# Patient Record
Sex: Female | Born: 1975 | Race: White | Hispanic: No | Marital: Single | Smoking: Former smoker
Health system: Southern US, Community
[De-identification: ages and names within clinical notes are randomized; demographics above are authoritative.]

## PROBLEM LIST (undated history)

## (undated) DIAGNOSIS — T7840XA Allergy, unspecified, initial encounter: Secondary | ICD-10-CM

## (undated) HISTORY — PX: EYE SURGERY: SHX253

## (undated) HISTORY — PX: TONSILLECTOMY: SUR1361

## (undated) HISTORY — DX: Allergy, unspecified, initial encounter: T78.40XA

---

## 2004-09-12 ENCOUNTER — Other Ambulatory Visit: Admission: RE | Admit: 2004-09-12 | Discharge: 2004-09-12 | Payer: Self-pay | Admitting: Obstetrics and Gynecology

## 2007-06-13 ENCOUNTER — Emergency Department (HOSPITAL_COMMUNITY): Admission: EM | Admit: 2007-06-13 | Discharge: 2007-06-13 | Payer: Self-pay | Admitting: Family Medicine

## 2007-06-13 ENCOUNTER — Ambulatory Visit: Payer: Self-pay | Admitting: Internal Medicine

## 2007-06-18 ENCOUNTER — Ambulatory Visit: Payer: Self-pay | Admitting: Internal Medicine

## 2010-01-19 ENCOUNTER — Telehealth: Payer: Self-pay | Admitting: Internal Medicine

## 2010-01-20 ENCOUNTER — Ambulatory Visit: Payer: Self-pay | Admitting: Internal Medicine

## 2010-01-20 DIAGNOSIS — R079 Chest pain, unspecified: Secondary | ICD-10-CM

## 2010-01-20 DIAGNOSIS — J939 Pneumothorax, unspecified: Secondary | ICD-10-CM | POA: Insufficient documentation

## 2010-01-20 DIAGNOSIS — J930 Spontaneous tension pneumothorax: Secondary | ICD-10-CM

## 2010-01-26 ENCOUNTER — Ambulatory Visit: Payer: Self-pay | Admitting: Internal Medicine

## 2010-01-31 ENCOUNTER — Encounter: Payer: Self-pay | Admitting: Internal Medicine

## 2010-02-13 ENCOUNTER — Ambulatory Visit: Payer: Self-pay | Admitting: Internal Medicine

## 2010-02-13 DIAGNOSIS — J45909 Unspecified asthma, uncomplicated: Secondary | ICD-10-CM | POA: Insufficient documentation

## 2010-11-14 NOTE — Assessment & Plan Note (Signed)
Summary: Pulmonary/ ext f/u ov with mild asthma flare rx dulera   Primary Provider/Referring Provider:  none  CC:  2 wk followup.  Pt declines to have cxr stating "I don't feel anything anymore".  She states that she is "getting over head cold"- c/o chest congestion and prod cough with clear sputum.Lori Jennings  History of Present Illness: 68 yowf quit smoking 2007 not typically limited by breathing including aerobics.  January 20, 2010 Acute visit.  Pt last seen in 2008 for pneumothorax- she c/o "sensation" on her left side- "feels uncomfortable" x 3 days acute onset when layed down to sleep.   She states that this worsens when she moves a certain way.  She states that she feels like she can not get in a deep enough breath. no cough.    January 26, 2010 1 wk followup.  Pt states that her left side discomfort is about 50 % improved. Denies any new complaints today, no cough.  Pt denies any significant sore throat, dysphagia, itching, sneezing,  nasal congestion or excess secretions,  fever, chills, sweats, unintended wt loss, exertional cp, hempoptysis, change in activity tolerance  orthopnea pnd or leg swelling.  no longer needing tramadol for pain.  Feb 13, 2010 2 wk followup.  Pt declines to have cxr stating "I don't feel anything anymore".  She states that she is "getting over head cold"- c/o chest congestion and prod cough with clear sputum. no sob or cp. Pt denies any significant sore throat, dysphagia, itching, sneezing,  nasal congestion or excess secretions,  fever, chills, sweats, unintended wt loss, laterallizing or classic  pleuritic or exertional cp, hempoptysis, change in activity tolerance  orthopnea pnd or leg swelling. Pt also denies any obvious fluctuation in symptoms with weather or environmental change or other alleviating or aggravating factors.       Current Medications (verified): 1)  Seasonique 0.15-0.03 &0.01 Mg Tabs (Levonorgest-Eth Estrad 91-Day) .... As Directed 2)  Aleve 220 Mg Tabs  (Naproxen Sodium) .... As Directed As Needed 3)  Advil 200 Mg Tabs (Ibuprofen) .... As Directed As Needed 4)  Multivitamins  Tabs (Multiple Vitamin) .Lori Jennings.. 1 Once Daily 5)  Tramadol Hcl 50 Mg  Tabs (Tramadol Hcl) .... One To Two By Mouth Every 4-6 Hours For Pain  Allergies (verified): No Known Drug Allergies  Past History:  Past Medical History: Pneumothorax Left      - Chest Tube in [place] in 2003     - Recurrence in Aug 2008, treated with bedrest     - Recurrence January 20, 2010 30% > 10% January 26, 2010      - Alpha 1 Genotype January 20, 2010 >> Asthma flare Feb 13, 2010       -  75% effective hfa Feb 13, 2010 > started on dulera 100  Vital Signs:  Patient profile:   35 year old female Weight:      156 pounds O2 Sat:      97 % on Room air Temp:     97.7 degrees F oral Pulse rate:   77 / minute BP sitting:   100 / 62  (left arm)  Vitals Entered By: Vernie Murders (Feb 13, 2010 11:46 AM)  O2 Flow:  Room air  Physical Exam  Additional Exam:  amb pleasant wf nad wt 159 January 20, 2010 > 158 January 26, 2010 > 156 Feb 13, 2010  HEENT mild turbinate edema.  Oropharynx no  thrush or excess pnd or cobblestoning.  No JVD or cervical adenopathy. Mild accessory muscle hypertrophy. Trachea midline, nl thryroid. Chest was hyperinflated by percussion with slt diminished breath sounds esp on Left and moderate increased exp time without wheeze. Hoover sign positive at mid inspiration. Regular rate and rhythm without murmur gallop or rub or increase P2 or edema.  Abd: no hsm, nl excursion. Ext warm without cyanosis or clubbing.     Impression & Recommendations:  Problem # 1:  PNEUMOTHORAX (ICD-512.8)  Clnically resolved but present exacerbation of asthma risks recurence  I had an extended discussion with the patient today lasting 15 to 20 minutes of a 25 minute visit on the following issues:   ok to rx consevatively but next step is vats/ pleurodesis if recurs and need to control risks such  as asthma flare  Orders: Est. Patient Level IV (82956)  Problem # 2:  ASTHMA, UNSPECIFIED (ICD-493.90) Clearly more of an asthmatic than previously appreciated in setting of uri or allergic rhinitis  I spent extra time with the patient today explaining optimal mdi  technique.  This improved from  25-75%  Medications Added to Medication List This Visit: 1)  Dulera 100-5 Mcg/act Aero (Mometasone furo-formoterol fum) .... 2 puffs first thing  in am and 2 puffs again in pm about 12 hours later  Patient Instructions: 1)  Dulera 100 2 puffs first thing  in am and 2 puffs again in pm about 12 hours later if have any symptoms at all - this wil reduce your wheezing and risk of a recurrence of pneumothorax from wheezing 2)  Go to ER directly if symptoms of pneumothorax recur Prescriptions: DULERA 100-5 MCG/ACT AERO (MOMETASONE FURO-FORMOTEROL FUM) 2 puffs first thing  in am and 2 puffs again in pm about 12 hours later  #1 x 11   Entered and Authorized by:   Nyoka Cowden MD   Signed by:   Nyoka Cowden MD on 02/13/2010   Method used:   Print then Give to Patient   RxID:   [id number]

## 2010-11-14 NOTE — Assessment & Plan Note (Signed)
Summary: Pulmonary/ f/u ptx, down to 10%    Primary Provider/Referring Provider:  none  CC:  1 wk followup.  Pt states that her left side discomfort is about 50 % improved. Denies any new complaints today.  History of Present Illness: 35 yowf quit smoking 2007 not typically limited by breathing including aerobics.  January 20, 2010 Acute visit.  Pt last seen in 2008 for pneumothorax- she c/o "sensation" on her left side- "feels uncomfortable" x 3 days acute onset when layed down to sleep.   She states that this worsens when she moves a certain way.  She states that she feels like she can not get in a deep enough breath. no cough.    January 26, 2010 1 wk followup.  Pt states that her left side discomfort is about 50 % improved. Denies any new complaints today, no cough.  Pt denies any significant sore throat, dysphagia, itching, sneezing,  nasal congestion or excess secretions,  fever, chills, sweats, unintended wt loss, exertional cp, hempoptysis, change in activity tolerance  orthopnea pnd or leg swelling.  no longer needing tramadol for pain.  Current Medications (verified): 1)  Seasonique 0.15-0.03 &0.01 Mg Tabs (Levonorgest-Eth Estrad 91-Day) .... As Directed 2)  Aleve 220 Mg Tabs (Naproxen Sodium) .... As Directed As Needed 3)  Advil 200 Mg Tabs (Ibuprofen) .... As Directed As Needed 4)  Multivitamins  Tabs (Multiple Vitamin) .Marland Kitchen.. 1 Once Daily 5)  Tramadol Hcl 50 Mg  Tabs (Tramadol Hcl) .... One To Two By Mouth Every 4-6 Hours For Pain  Allergies (verified): No Known Drug Allergies  Past History:  Past Medical History: Pneumothorax Left      - Chest Tube in [place] in 2003     - Recurrence in Aug 2008, treated with bedrest     - Recurrence January 20, 2010 30% > 10% January 26, 2010      - Alpha 1 Genotype January 20, 2010 >>  Vital Signs:  Patient profile:   35 year old female Weight:      158 pounds O2 Sat:      98 % on Room air Temp:     98.2 degrees F oral Pulse rate:   76 /  minute BP sitting:   94 / 60  (left arm)  Vitals Entered By: Vernie Murders (January 26, 2010 10:37 AM)  O2 Flow:  Room air  Physical Exam  Additional Exam:  amb pleasant wf nad wt 159 January 20, 2010 > 158 January 26, 2010  HEENT mild turbinate edema.  Oropharynx no thrush or excess pnd or cobblestoning.  No JVD or cervical adenopathy. Mild accessory muscle hypertrophy. Trachea midline, nl thryroid. Chest was hyperinflated by percussion with slt diminished breath sounds esp on Left and moderate increased exp time without wheeze. Hoover sign positive at mid inspiration. Regular rate and rhythm without murmur gallop or rub or increase P2 or edema.  Abd: no hsm, nl excursion. Ext warm without cyanosis or clubbing.     CXR  Procedure date:  01/26/2010  Findings:      Comparison: 01/20/2010.   Findings: There is an approximate 10% left pneumothorax.  The pneumothorax is smaller than noted previously.  Normal cardiomediastinal silhouette.  Bony thorax intact.   IMPRESSION: Approximate 10% left pneumothorax.  Impression & Recommendations:  Problem # 1:  PNEUMOTHORAX (ICD-512.8) Responding to conservative rx.  Discussed in detail all the  indications, usual  risks and alternatives  relative to the benefits  with patient who agrees to proceed with conservative rx  Other Orders: T-2 View CXR (71020TC) Est. Patient Level III (16109)  Patient Instructions: 1)  We will with xray report

## 2010-11-14 NOTE — Assessment & Plan Note (Signed)
Summary: Pulmonary/ acute ex eval for recurrent ptx    Primary Provider/Referring Provider:  none  CC:  Acute visit.  Pt last seen in 2008 for pneumothorax- she c/o "sensation" on her left side- "feels uncomfortable" x 3 days.  She states that this worsens when she moves a certain way.  She states that she feels like she can not get in a deep enough breath.  .  History of Present Illness: 46 yowf quit smoking 2007 not typically limited by breathing including aerobics.  January 20, 2010 Acute visit.  Pt last seen in 2008 for pneumothorax- she c/o "sensation" on her left side- "feels uncomfortable" x 3 days acute onset when layed down to sleep.   She states that this worsens when she moves a certain way.  She states that she feels like she can not get in a deep enough breath. no cough.  Pt denies any significant sore throat, dysphagia, itching, sneezing,  nasal congestion or excess secretions,  fever, chills, sweats, unintended wt loss,  hempoptysis, change in activity tolerance  orthopnea pnd or leg swelling .   Current Medications (verified): 1)  Seasonique 0.15-0.03 &0.01 Mg Tabs (Levonorgest-Eth Estrad 91-Day) .... As Directed 2)  Aleve 220 Mg Tabs (Naproxen Sodium) .... As Directed As Needed 3)  Advil 200 Mg Tabs (Ibuprofen) .... As Directed As Needed 4)  Multivitamins  Tabs (Multiple Vitamin) .Marland Kitchen.. 1 Once Daily  Allergies (verified): No Known Drug Allergies  Past History:  Past Medical History: Pneumothorax Left      - Chest Tube in [place] in 2003     - Reucurrence in Aug 2008, treated with bedrest     - Recurrence January 20, 2010 30%      - Alpha 1 Genotype January 20, 2010 >>  Family History: Negative for respiratory diseases or atopy   Social History: Former smoker.  Quit in 2007.  Vital Signs:  Patient profile:   35 year old female Height:      67 inches Weight:      159.38 pounds BMI:     25.05 O2 Sat:      97 % on Room air Temp:     98.1 degrees F oral Pulse rate:    71 / minute BP sitting:   110 / 74  (left arm)  Vitals Entered By: Vernie Murders (January 20, 2010 9:46 AM)  O2 Flow:  Room air  Physical Exam  Additional Exam:  amb pleasant wf nad wt 159 January 20, 2010  HEENT mild turbinate edema.  Oropharynx no thrush or excess pnd or cobblestoning.  No JVD or cervical adenopathy. Mild accessory muscle hypertrophy. Trachea midline, nl thryroid. Chest was hyperinflated by percussion with slt diminished breath sounds esp on Left and moderate increased exp time without wheeze. Hoover sign positive at mid inspiration. Regular rate and rhythm without murmur gallop or rub or increase P2 or edema.  Abd: no hsm, nl excursion. Ext warm without cyanosis or clubbing.     CXR  Procedure date:  01/20/2010  Findings:      Comparison: [facility] chest x-ray 06/18/2007 and [facility] chest x-ray 06/13/2007.   Findings: Since prior studies, recurrent left approximate 30% pneumothorax is seen.  Lungs are otherwise clear.  No tension shift seen.  Mediastinum, hila, heart size and configuration appear normal.  Osseous structures appear normal.  No pleural fluid visualized.   IMPRESSION:   1.  Recurrent approximate 30% left pneumothorax  without tension shift. 2.  No additional acute abnormality.  Impression & Recommendations:  Problem # 1:  PNEUMOTHORAX (ICD-512.8)  Recurrent on left and should probably consier pleurodesis  Discussed in detail all the  indications, usual  risks and alternatives  relative to the benefits with patient who agrees to proceed with conservative rx.  See instructions for specific recommendations    Orders: Est. Patient Level V (50093)  Medications Added to Medication List This Visit: 1)  Seasonique 0.15-0.03 &0.01 Mg Tabs (Levonorgest-eth estrad 91-day) .... As directed 2)  Aleve 220 Mg Tabs (Naproxen sodium) .... As directed as needed 3)  Advil 200 Mg Tabs (Ibuprofen) .... As directed as needed 4)   Multivitamins Tabs (Multiple vitamin) .Marland Kitchen.. 1 once daily 5)  Tramadol Hcl 50 Mg Tabs (Tramadol hcl) .... One to two by mouth every 4-6 hours for pain  Other Orders: T-2 View CXR (71020TC)  Patient Instructions: 1)  Bedrest x 72 hours 2)  Tramadol 50 mg 1-2 every 4-6 hours as needed for pain 3)  Go to ER for worsen 4)  Please schedule a follow-up appointment in 1 week, sooner if needed  Prescriptions: TRAMADOL HCL 50 MG  TABS (TRAMADOL HCL) One to two by mouth every 4-6 hours for pain  #40 x 0   Entered and Authorized by:   Nyoka Cowden MD   Signed by:   Nyoka Cowden MD on 01/20/2010   Method used:   Electronically to        [facility]  [address] [phone] (retail)       [address]       [place], [place]  [address]       Ph: [phone]       Fax: [phone]   RxID:   [id number]

## 2010-11-14 NOTE — Progress Notes (Signed)
Summary: On Call- ? Recurrent pneumothorax  Phone Note Call from Patient   Summary of Call: On call- Patient last saw Dr Sherene Sires for second spontaneous pneumothorax in 2008. Calls this evening saying she feels again on same side as if she has another, starting this afternoon with some progression. Roomate will be home in 2 hours. I told her to go now to ER for CXR and eval. She indicated she wanted to wait it out and come to office in AM, hoping like last time to avoid chest tube. I could not support this safely and explained potential for rapid worsening, potential barotrauma. Told her not Urgent Care but ER. I think she is going to try to hold out and call office in AM. Initial call taken by: Waymon Budge MD,  January 19, 2010 7:41 PM

## 2011-02-20 ENCOUNTER — Other Ambulatory Visit: Payer: Self-pay | Admitting: Family Medicine

## 2011-02-20 DIAGNOSIS — I720 Aneurysm of carotid artery: Secondary | ICD-10-CM

## 2011-02-23 ENCOUNTER — Ambulatory Visit
Admission: RE | Admit: 2011-02-23 | Discharge: 2011-02-23 | Disposition: A | Discharge status: Home / Self Care | Payer: 59 | Source: Ambulatory Visit | Attending: Family Medicine | Admitting: Family Medicine

## 2011-02-23 DIAGNOSIS — I720 Aneurysm of carotid artery: Secondary | ICD-10-CM

## 2011-02-27 NOTE — Assessment & Plan Note (Signed)
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  She says she is  100% feeling better today with no pleuritic pain, fevers, chills sweats,  or dyspnea.   PHYSICAL EXAMINATION:  She is a pleasant, ambulatory white female in no  acute distress.  VITAL SIGNS:  Stable vital signs.  HEENT:  Unremarkable.  Oropharynx is clear.  There is no evidence of  subcutaneous air.  LUNGS:  The lung fields are clear bilaterally to auscultation and  percussion, and Hamman's sign is negative.  CARDIAC:  Regular rate and rhythm without murmur, gallop, or rub.  ABDOMEN:  Soft and benign.  EXTREMITIES:  Warm without calf tenderness, cyanosis, clubbing, or  edema.   Chest x-ray shows no pneumothorax.   IMPRESSION:  A very small apical pneumothorax spontaneously resolved,  status post previous chest pneumothorax in 2003.  Although technically,  she has now had a recurrence and is at risk for further pneumothoraces,  note she had a normal CT scan in 2003 and has no obvious bullus lesions  nor evidence of air flow obstruction clinically, status post smoking  cessation in 2007.   I therefore recommended conservative followup noting she is really not  at risk (she does not scuba dive or fly anything but commercial air  traffic, and no wilderness trips).  Therefore, if she has recurrent  symptoms, she can go to the emergency room for consideration for a VATS  pleurodesis.  We will see her back here on a p.r.n. basis.     Charlaine Dalton. Sherene Sires, MD, [facility]  Electronically Signed    MBW/MedQ  DD: 06/18/2007  DT: 06/18/2007  Job #: [id number]   cc:   Dr. [facility]

## 2011-02-27 NOTE — Assessment & Plan Note (Signed)
[facility]                             PULMONARY OFFICE NOTE   NAME:Seliga, Ange                           MRN:          [id number]  DATE:06/13/2007                            DOB:          01-22-1976    CHIEF COMPLAINT:  Chest pain.   HISTORY:  This is a 35 year old white female who developed a  pneumothorax in December of 2003 spontaneously and underwent chest tube  placement for less than 24 hours before it recurred and then required  chest tube placement for 3 subsequent days.  Since that time, she has  had no trouble at all and has been enjoying excellent aerobic exercise  tolerance.  She does remember straining hard having a bowel movement  yesterday, and then approximately 24 hours ago developed left-sided  shoulder pain that waxed and waned over the next 24 hours.  It did not  stop her from getting on an airplane yesterday to fly home from [place],  but she did notice, when she tried to lie down last night, that the pain  was worse and came to urgent care this morning where she was diagnosed  with a very small left pneumothorax.   The patient denies any cough.  She denies any significant dyspnea at  present, orthopnea, PND, or leg swelling, myalgias, arthralgias, fevers,  chills.   PAST MEDICAL HISTORY:  Significant for hyperlipidemia and remote  tonsillectomy.   MEDICATIONS:  Depo-Provera for contraceptives.   SOCIAL HISTORY:  She quit smoking in January of 2007.  She works as an  Chiropodist.   FAMILY HISTORY:  Negative for respiratory diseases, atopy.   REVIEW OF SYSTEMS:  Taken in detail in the worksheet and negative except  as outlined above.   PHYSICAL EXAMINATION:  This is a healthy-appearing ambulatory white  female in no acute distress.  VITAL SIGNS:  Stable vital signs.  HEENT:  Unremarkable.  Oropharynx is clear.  NECK:  Supple without cervical adenopathy or tenderness. Trachea is  midline.  No thyromegaly.  LUNGS:  The  lung fields are perfectly clear bilaterally to auscultation  and percussion.  I could not appreciate any crepitus over the shoulder  or chest wall.  CARDIAC:  Regular rate and rhythm without murmur, gallop, or rub, or  Hamman's crunch appreciable.  ABDOMEN:  Soft and benign.  No palpable organomegaly, mass, or  tenderness.  EXTREMITIES:  Warm without calf tenderness, cyanosis, clubbing, or  edema.   I reviewed a chest x-ray and agree that she has a very tiny left apical  pneumothorax with no associated pleural effusion.   She tells me that she had a CT scan done 5 years ago that showed no  blebs or abnormalities.   IMPRESSION:  Recurrent spontaneous left pneumothorax.  It is very small  and it has been almost 24 hours since the onset and is barely  detectable.  It very well may be that the defect is so small that it  will close spontaneously and not need further therapy.  I have  recommended, therefore, bed rest and Vicodin to  supplement Delsym and  coughing over the next 5 days, and then return here for a chest x-ray.  Any time, if her pain worsens or if she becomes more short of breath,  then I have asked her to go straight to the emergency room, where I  believe video-assisted thoracic surgery pleurodesis would be the best  option, perhaps with a bridge Heimlich valve versus routine  thoracostomy tube, depending on the urgency of the situation.     Charlaine Dalton. Sherene Sires, MD, [facility]  Electronically Signed    MBW/MedQ  DD: 06/13/2007  DT: 06/14/2007  Job #: [id number]   cc:   Janelle Floor, DO

## 2011-02-27 NOTE — Assessment & Plan Note (Signed)
[facility]                             PULMONARY OFFICE NOTE   NAME:Vetter, Keriann                           MRN:          [id number]  DATE:06/18/2007                            DOB:          03-Jul-1976    CANCELLED DICTATION     Casimiro Needle B. Sherene Sires, MD, [facility]  Electronically Signed    MBW/MedQ  DD: 06/18/2007  DT: 06/18/2007  Job #: [id number]

## 2011-06-20 ENCOUNTER — Telehealth: Payer: Self-pay | Admitting: Internal Medicine

## 2011-06-20 NOTE — Telephone Encounter (Signed)
Spoke with the Lori Jennings and she states she has seen Dr. Sherene Sires in the past for a pneumothorax. She states she thinks she might have another one and was requesting to get a CXR now and have it reviewed because she is flying out in the morning for a business conference. I advised the Lori Jennings that since she was last seen 02-2010 she would need an OV to be evaluated an nothing is available at this time. I advised the Lori Jennings to go to an Urgent Care where she could get an cxr and see a doctor at same time. Lori Jennings states understanding. Carron Curie, CMA

## 2012-01-03 ENCOUNTER — Other Ambulatory Visit: Payer: Self-pay | Admitting: Obstetrics and Gynecology

## 2012-01-03 DIAGNOSIS — R928 Other abnormal and inconclusive findings on diagnostic imaging of breast: Secondary | ICD-10-CM

## 2012-01-10 ENCOUNTER — Ambulatory Visit
Admission: RE | Admit: 2012-01-10 | Discharge: 2012-01-10 | Disposition: A | Discharge status: Home / Self Care | Payer: 59 | Source: Ambulatory Visit | Attending: Obstetrics and Gynecology | Admitting: Obstetrics and Gynecology

## 2012-01-10 DIAGNOSIS — R928 Other abnormal and inconclusive findings on diagnostic imaging of breast: Secondary | ICD-10-CM

## 2012-01-11 IMAGING — MG MM DIGITAL DIAGNOSTIC UNILAT*L*
3 series · 3 of 3 positions shown · non-contrast
Comparison: None.

CLINICAL DATA: Patient presents for additional views of the left
breast as follow-up to a recent screening exam suggesting a
possible mass.

DIGITAL DIAGNOSTIC LEFT MAMMOGRAM  AND LEFT BREAST ULTRASOUND:

[L CC]
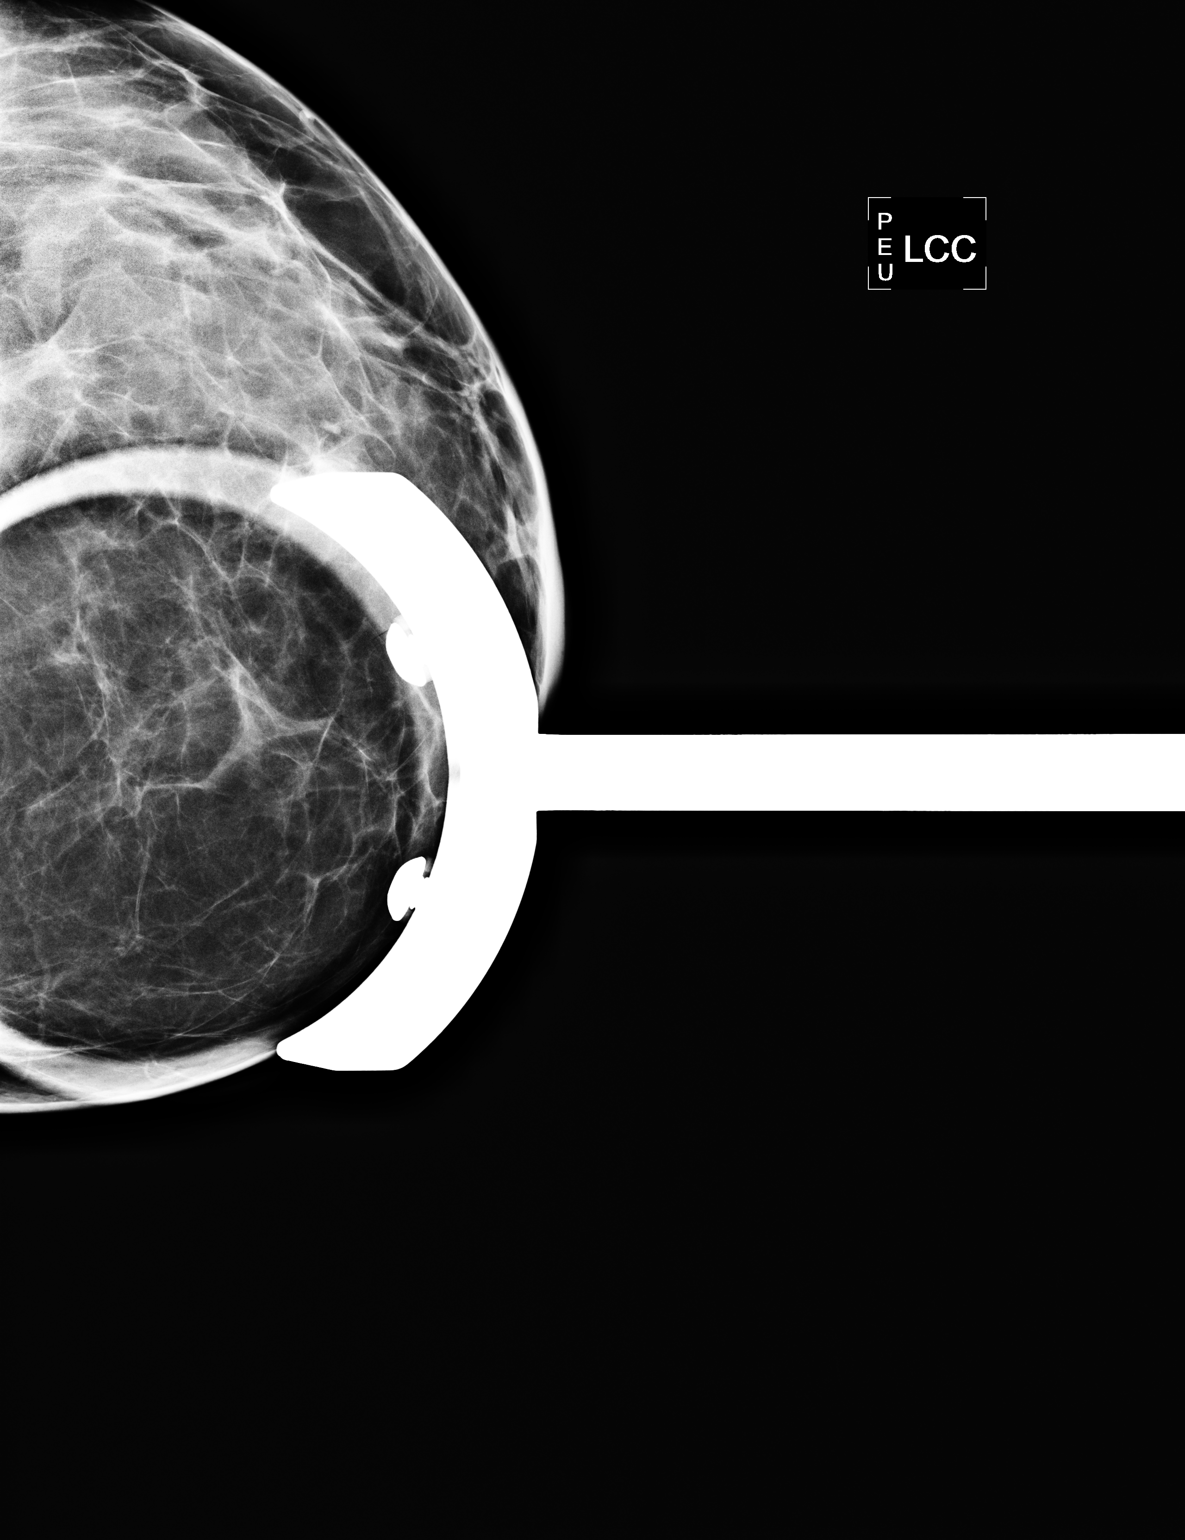

[L MLO]
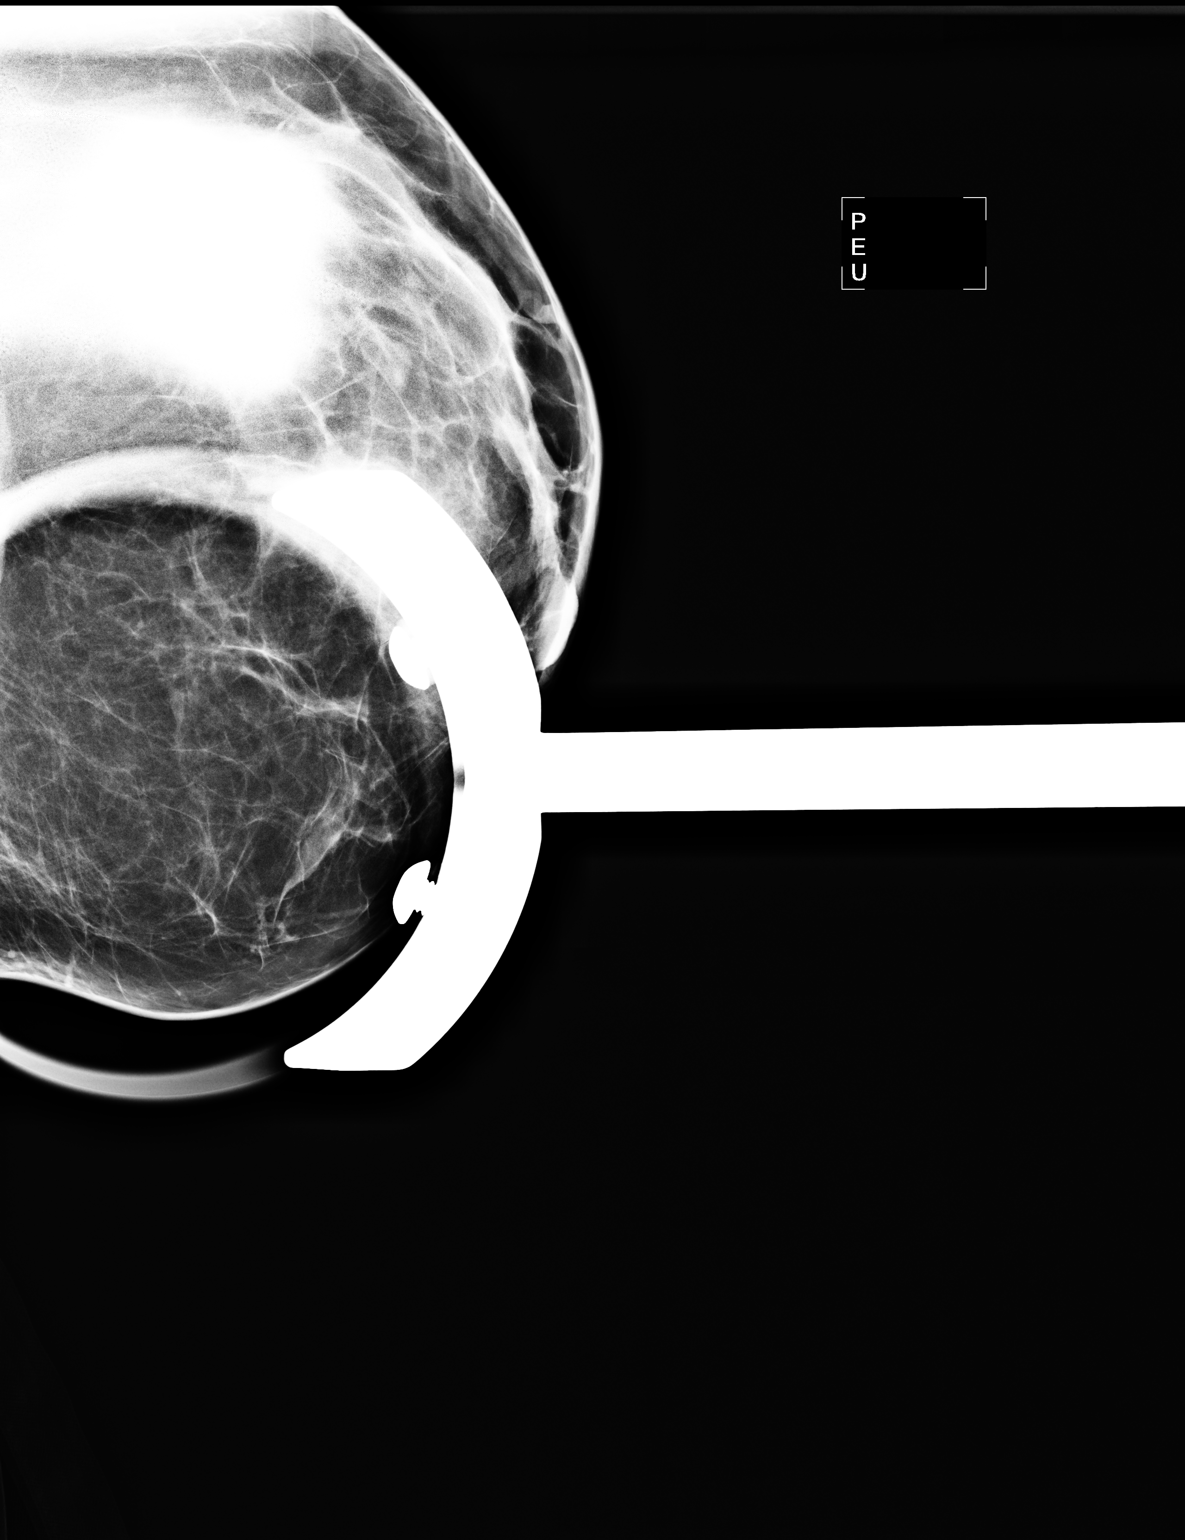

[L ML]
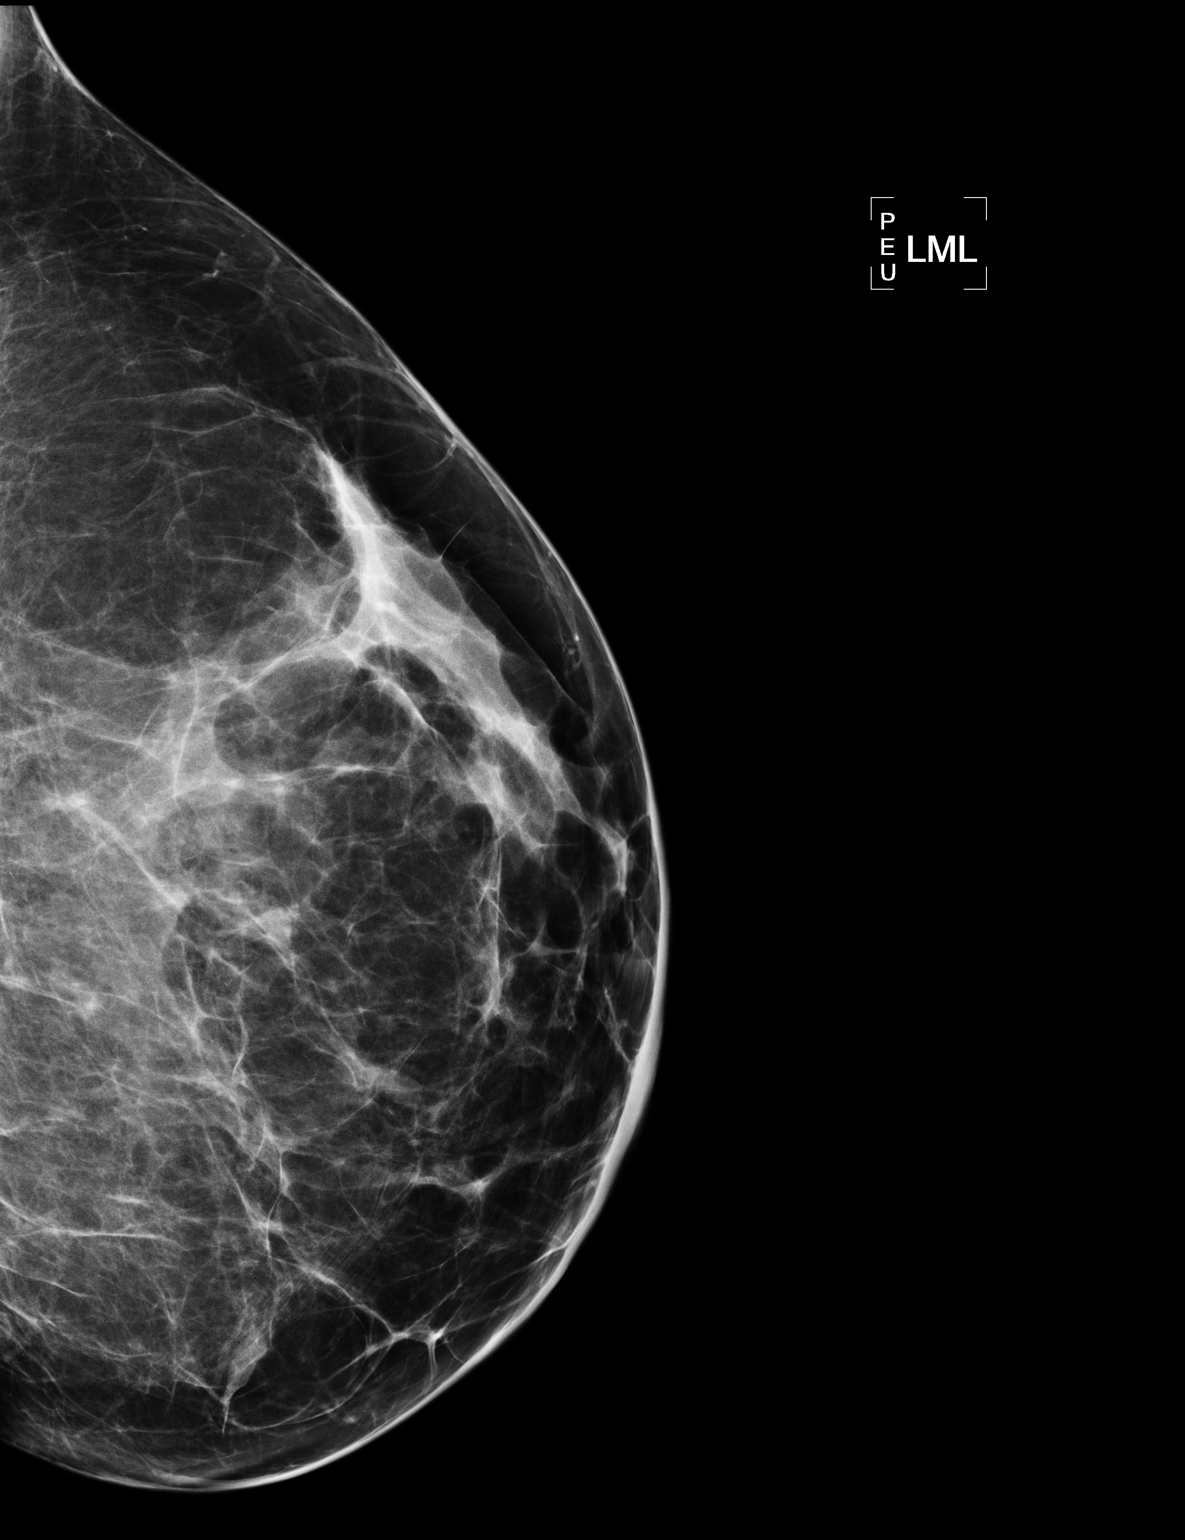

[3 of 3 positions shown; findings below may reference images not displayed]

FINDINGS: Spot compression and true lateral images were obtained.
There is no definite focal abnormality on the compression CC image
although there is persistence of a an equivocal well defined ovoid
density in the inferior breast on the compression MLO and true
lateral images.

Ultrasound is performed, showing no focal abnormality over the
inferior left breast.
IMPRESSION: Probable benign density over the inferior left breast not seen
sonographically.

Recommendations:  Recommend 6-month follow-up diagnostic left
breast mammogram.

BI-RADS CATEGORY 3:  Probably benign finding(s) - short interval
follow-up suggested.

## 2012-10-25 ENCOUNTER — Ambulatory Visit: Payer: 59 | Admitting: Family Medicine

## 2012-10-25 ENCOUNTER — Encounter: Payer: Self-pay | Admitting: Family Medicine

## 2012-10-25 VITALS — BP 98/66 | HR 86 | Resp 16

## 2012-10-25 DIAGNOSIS — R519 Headache, unspecified: Secondary | ICD-10-CM

## 2012-10-25 DIAGNOSIS — S0180XA Unspecified open wound of other part of head, initial encounter: Secondary | ICD-10-CM

## 2012-10-25 DIAGNOSIS — Z23 Encounter for immunization: Secondary | ICD-10-CM

## 2012-10-25 DIAGNOSIS — S0191XA Laceration without foreign body of unspecified part of head, initial encounter: Secondary | ICD-10-CM

## 2012-10-25 DIAGNOSIS — R51 Headache: Secondary | ICD-10-CM

## 2012-10-25 DIAGNOSIS — R55 Syncope and collapse: Secondary | ICD-10-CM

## 2012-10-25 NOTE — Progress Notes (Signed)
Verbal consent obtained from the patient.  Local anesthesia with 6cc Lidocaine 2% with epinephrine.  Wound scrubbed with soap and water and rinsed.  Wound closed with #5 5-0 Vicryl SQ, and #11 5-0 Ethilon SI & HM sutures.  Wound cleansed and dressed.

## 2012-10-25 NOTE — Progress Notes (Signed)
This is a 37 year old woman who comes in after having a syncopal episode in having her for had, suffering a laceration on the right and central part of her forehead. Patient describes starting a "cleansing program" yesterday. When she got up this morning she was a little lightheaded went into the kitchen to fix a shake. He was at this point that she became dizzy passed out and hit her head..  The patient recovered consciousness, she was diaphoretic and weak. She cleansed her laceration and drove herself to the urgent care here. At the present time she has mild headache but has no dizziness, diplopia, difficulty moving.  Objective: No acute distress. Patient was brought back urgently because of the laceration 5 cm laceration is noted which extends diagonally above the right eye for the upper mid for head. There is no bony depression underneath. It is tender. Patient was given ibuprofen for the headache which subsided over time.  Blood pressure checking revealed a reading of 90/60 sitting as well as 90/60 standing  Neurological exam: Alert, cooperative, appropriate. Cranial nerves III through XII: Intact with pupils equal and reactive as well. TMs normal, no battle sign Neck full range of motion nontender Chest: Clear Heart: Regular no murmur Abdomen soft nontender  Wound is repaired by physician assistant, she'll Tinnie Gens  Assessment: Syncopal episode secondary to change in dietary pattern and mild dehydration with 5 cm full-thickness laceration in the forehead. Patient stable at present. She was given fluids and crackers to support her blood pressure and she felt comfortable driving home.  Plan: Return 5-7 days for suture removal, call if she develops nausea, worsening headache, weakness, further syncope

## 2012-10-25 NOTE — Patient Instructions (Signed)

## 2012-10-26 ENCOUNTER — Telehealth: Payer: Self-pay

## 2012-10-26 NOTE — Telephone Encounter (Signed)
PATIENT IS REQUESTING A REFERRAL FOR CT SCAN FOR HEAD INJURY - SIXTEEN STITCHES PLACED 10/25/12 HERE. LIVES BY SELF   LITTLE FATIGUED, OTHERWISE OKAY, CONCERNED B/C SHE LIVES ALONE    CBN:  [id number] (H)

## 2012-10-27 ENCOUNTER — Telehealth: Payer: Self-pay

## 2012-10-27 NOTE — Telephone Encounter (Signed)
Message has already been sent to PA pool. Duplicate. Advised patient we are working on this.

## 2012-10-27 NOTE — Telephone Encounter (Signed)
Please advise on CT scan

## 2012-10-27 NOTE — Telephone Encounter (Signed)
Sounds like exam was reassuring and there was no indication for imaging at the time, but will forward to Dr. Elbert Ewings to advise on referral for CT scan

## 2012-10-27 NOTE — Telephone Encounter (Signed)
Pt is calling needing to talk with dr Milus Glazier about her recent visit and if there is going to be a referral of a ct scan   Please call [phone]

## 2012-10-28 ENCOUNTER — Ambulatory Visit (INDEPENDENT_AMBULATORY_CARE_PROVIDER_SITE_OTHER): Payer: 59 | Admitting: Family Medicine

## 2012-10-28 ENCOUNTER — Telehealth: Payer: Self-pay | Admitting: Radiology

## 2012-10-28 VITALS — BP 91/65 | HR 84 | Temp 98.1°F | Resp 18 | Ht 67.0 in | Wt 157.0 lb

## 2012-10-28 DIAGNOSIS — S060XAA Concussion with loss of consciousness status unknown, initial encounter: Secondary | ICD-10-CM

## 2012-10-28 DIAGNOSIS — R51 Headache: Secondary | ICD-10-CM

## 2012-10-28 DIAGNOSIS — S060X9A Concussion with loss of consciousness of unspecified duration, initial encounter: Secondary | ICD-10-CM | POA: Insufficient documentation

## 2012-10-28 DIAGNOSIS — R55 Syncope and collapse: Secondary | ICD-10-CM

## 2012-10-28 NOTE — Telephone Encounter (Signed)
Patient should come in today for follow up.  I would see her myself, but I am not allowed to do so per [facility] directive.

## 2012-10-28 NOTE — Progress Notes (Signed)
Nolie Fantroy is a 37 y.o. female who presents to [facility] today for followup for head laceration and probable concussion. Patient was seen on 10/25/12 following a fall at home.  She had not eat and felt lightheaded preparing breakfast and passed out and fell to the floor striking her head on the ground.  She suffered a large for head laceration which was repaired in the office.  Following the injury she felt a mild headache and fogginess for several days.  She notes that currently she feels well with no headaches fogginess confusion fatigue photo or photosensitivity, dizziness or lightheadedness.    Prior to the fall patient was doing a cleanse had not eaten well.  She felt immediately better following eating.     PMH: Reviewed otherwise healthy History  Substance Use Topics  . Smoking status: Current Every Day Smoker  . Smokeless tobacco: Not on file     Comment: 10-15 cigarettes per day  . Alcohol Use: Yes     Comment: rare   ROS as above  Medications reviewed. No current outpatient prescriptions on file.    Exam:  BP 91/65  Pulse 84  Temp 98.1 F (36.7 C) (Oral)  Resp 18  Ht 5\' 7"  (1.702 m)  Wt 157 lb (71.215 kg)  BMI 24.59 kg/m2  SpO2 97%  LMP 10/24/2012 Gen: Well NAD Forehead: Well appearing laceration with sutures no erythema or exudate. Neuro: Alert and oriented normal balance gait station. Concentration and memory are intact. Normal serial sevens. Normal coordination and strength sensation intact. Cranial 2 through 12 are intact  No results found for this or any previous visit (from the past 72 hour(s)).  Assessment and Plan: 37 y.o. female with possible concussion following a fall and for head laceration. She is doing well. Advised return to work as tolerated.   As for the cause of her fall I feel that it was orthostatic hypotension related to her fast.  It does not appear to be cardiogenic or dangerous in origin. She will followup with her primary care Dr. about this as  needed. Discussed warning signs or symptoms. Please see discharge instructions. Patient expresses understanding.

## 2012-10-28 NOTE — Telephone Encounter (Signed)
Ok to order CT scan and result it to Dr. Elbert Ewings.

## 2012-10-28 NOTE — Telephone Encounter (Signed)
Patient advised, per Dr L to come in for this again.

## 2012-10-28 NOTE — Patient Instructions (Addendum)
Thank you for coming in today. I think you may have had a concussion.  Go back to work tomorrow. If you get a headache or feel bad take it easy.  Most concussions last 7 -10 days.  Come back as needed.   Concussion Direct trauma to the head often causes a condition known as a concussion. This injury will interfere with brain function and may cause you to lose consciousness. The consequences of a concussion are usually temporary, but repetitive concussions can be very dangerous. If you have multiple concussions, you will have a greater risk of long-term effects, such as slurred speech, slow movements, impaired thinking, or tremors. The severity of a concussion is based on the length and severity of the interference with brain activity. SYMPTOMS   Symptoms of a concussion vary depending on the severity of the injury. Very mild concussions may even occur without any noticeable symptoms. Swelling in the area of the injury is not related to the seriousness of the injury.    Mild concussion:   Temporary loss of consciousness.   Memory loss (amnesia) for a short time.   Emotional instability.   Confusion.   Severe concussion:   Usually prolonged loss of consciousness.   One pupil (the black part in the middle of the eye) is larger than the other.   Changes in vision (including blurring).   Changes in breathing.   Disturbed balance (equilibrium).   Headaches.   Confusion.   Nausea or vomiting.  CAUSES   A concussion is the result of trauma to the head. When the head is subjected to such an injury, the brain strikes against the inner wall of the skull. This impact is what causes the damage to the brain. The force of injury is related to severity of injury. The most severe concussions are associated with incidents that involve large impact forces such as motor vehicle accidents. Wearing a helmet will reduce the severity of trauma to the head, but concussions may still occur if you are  wearing a helmet. RISK INCREASES WITH:  Contact sports (football, hockey, rugby, or lacrosse).   Fighting sports (martial arts or boxing).   Riding bicycles, motorcycles, or horses (when you ride without a helmet).  PREVENTION  Wear proper protective headgear and ensure correct fit.   Wear seat belts when driving and riding in a car.   Do not drink or use mind-altering drugs and drive.  PROGNOSIS   Concussions are typically curable if they are recognized and treated early. If a severe concussion or multiple concussions go untreated, then the complications may be life-threatening or cause permanent disability and brain damage. RELATED COMPLICATIONS    Permanent brain damage (slurred speech, slow movement, impaired thinking, or tremors).   Bleeding under the skull (subdural hemorrhage or hematoma, epidural hematoma).   Bleeding into the brain.   Prolonged healing time if usual activities are resumed too soon.   Infection if skin over the concussion site is broken.   Increased risk of future concussions (less trauma is required for a second concussion than the first).  TREATMENT   Treatment initially requires immediate evaluation to determine the severity of the concussion. Occasionally, a hospital stay may be required for observation and treatment.   Avoid exertion. Bed rest for the first 24 to 48 hours is recommended.   Return to play is a controversial subject due to the increased risk for future injury as well as permanent disability and should be discussed at length with your  treating caregiver. Many factors such as the severity of the concussion and whether this is the first, second, or third concussion play a role in timing a patient's return to sports.   MEDICATION   Do not give any medicine, including non-prescription acetaminophen or aspirin, until the diagnosis is certain. These medicines may mask developing symptoms.   SEEK IMMEDIATE MEDICAL CARE IF:    Symptoms get  worse or do not improve in 24 hours.   Any of the following symptoms occur:   Vomiting.   The inability to move arms and legs equally well on both sides.   Fever.   Neck stiffness.   Pupils of unequal size, shape, or reactivity.   Convulsions.   Noticeable restlessness.   Severe headache that persists for longer than 4 hours after injury.   Confusion, disorientation, or mental status changes.  Document Released: 10/01/2005 Document Revised: 12/24/2011 Document Reviewed: 01/13/2009 [facility] Patient Information 2013 [place], [place].

## 2012-10-28 NOTE — Telephone Encounter (Signed)
Patient calling again about scan, she states she is having a dull ache in her temple at the area of injury and is very concerned, message sent to Dr L, but not responded to yet, can we  Order a CT scan, or should pt return to clinic?

## 2012-10-28 NOTE — Telephone Encounter (Signed)
Called patient to advise she would need an office visit for this.

## 2012-11-01 ENCOUNTER — Ambulatory Visit (INDEPENDENT_AMBULATORY_CARE_PROVIDER_SITE_OTHER): Payer: 59 | Admitting: Family Medicine

## 2012-11-01 VITALS — BP 100/70 | HR 80 | Temp 98.0°F | Resp 16 | Ht 67.0 in | Wt 156.8 lb

## 2012-11-01 DIAGNOSIS — Z4802 Encounter for removal of sutures: Secondary | ICD-10-CM

## 2012-11-01 NOTE — Progress Notes (Signed)
   Patient ID: Lori Jennings MRN: [id number], DOB: 1976/05/02 37 y.o. Date of Encounter: 11/01/2012, 11:21 AM  Primary Physician: No primary provider on file.  Chief Complaint: Suture removal    See note from Jan. 11  HPI: 37 y.o. y/o female with injury to forehead Here for suture removal s/p placement on forehead Doing well No issues/complaints Afebrile/ No chills No erythema No pain Able to move without difficulty Normal sensation  History reviewed. No pertinent past medical history.   Home Meds: Prior to Admission medications   Not on File    Allergies:  Allergies  Allergen Reactions  . Ciprocinonide (Fluocinolone) Hives    Physical Exam: Blood pressure 100/70, pulse 80, temperature 98 F (36.7 C), temperature source Oral, resp. rate 16, height 5\' 7"  (1.702 m), weight 156 lb 12.8 oz (71.124 kg), last menstrual period 10/24/2012, SpO2 98.00%., Body mass index is 24.56 kg/(m^2). General: Well developed, well nourished, in no acute distress. Head: Normocephalic, atraumatic, sclera non-icteric, no xanthomas, nares are without discharge.  Neck: Supple. Lungs: Breathing is unlabored. Heart: Normal rate. Msk:  Strength and tone appear normal for age. Wound:  Wound well health without erythema, swelling, or tenderness to palpation. FROM and 5/5 strength with abnormal sensation above the laceration (mild anesthesia) Skin: See above, otherwise dry without rash or erythema. Extremities: No clubbing or cyanosis. No edema. Neuro: Alert and oriented X 3. Moves all extremities spontaneously.  Psych:  Responds to questions appropriately with a normal affect.   PROCEDURE: Verbal consent obtained. 11 sutures removed without difficulty.  Assessment and Plan: 37 y.o. y/o female here for suture removal for wound described above. -Sutures removed per above -Wound resolved -RTC prn  Signed, Elvina Sidle, MD 11/01/2012 11:21 AM

## 2012-11-07 ENCOUNTER — Telehealth: Payer: Self-pay | Admitting: Radiology

## 2012-11-07 NOTE — Telephone Encounter (Signed)
Patients CT scan has been denied by the insurance, patient improving/ Please cancel the scan, I have advised her already the insurance will not cover scan.

## 2012-12-31 ENCOUNTER — Ambulatory Visit: Payer: 59 | Admitting: Physician Assistant

## 2012-12-31 VITALS — BP 90/60 | HR 71 | Temp 98.6°F | Resp 18 | Wt 165.0 lb

## 2012-12-31 DIAGNOSIS — L03115 Cellulitis of right lower limb: Secondary | ICD-10-CM

## 2012-12-31 DIAGNOSIS — L02419 Cutaneous abscess of limb, unspecified: Secondary | ICD-10-CM

## 2012-12-31 MED ORDER — DOXYCYCLINE HYCLATE 100 MG PO CAPS: 100.0000 mg | ORAL_CAPSULE | Freq: Two times a day (BID) | ORAL | Status: DC

## 2012-12-31 NOTE — Progress Notes (Signed)
   Patient ID: Lori Jennings MRN: [id number], DOB: [birth year], 37 y.o. Date of Encounter: 12/31/2012, 7:15 PM  Primary Physician: No primary provider on file.  Chief Complaint: Right knee abrasion x 10 days  HPI: 37 y.o. year old female with history below presents with a right knee abrasion for 10 days. Patient was roller skating 10 days ago and went to come off of the skating rink hitting her right knee on a stair in a "rug burn" fashion she states. She was wearing jeans at the time. Since the injury she has washed the wound two times per day and left it open to the air. She has been working out daily. She is concerned because is has become slightly more erythematous and a little sore. Afebrile throughout. She is otherwise doing well and without complaints.    Past Medical History  Diagnosis Date  . Allergy      Home Meds: Prior to Admission medications   Medication Sig Start Date End Date Taking? Authorizing Provider           Allergies:  Allergies  Allergen Reactions  . Ciprocinonide (Fluocinolone) Hives    History   Social History  . Marital Status: Single    Spouse Name: N/A    Number of Children: N/A  . Years of Education: N/A   Occupational History  . Not on file.   Social History Main Topics  . Smoking status: Former Games developer  . Smokeless tobacco: Not on file     Comment: 10-15 cigarettes per day  . Alcohol Use: Yes     Comment: rare  . Drug Use: No  . Sexually Active: No   Other Topics Concern  . Not on file   Social History Narrative  . No narrative on file     Review of Systems: Constitutional: negative for chills, fever, or fatigue  Dermatological: positive for wound and erythema   Physical Exam: Blood pressure 90/60, pulse 71, temperature 98.6 F (37 C), temperature source Oral, resp. rate 18, weight 165 lb (74.844 kg), last menstrual period 12/17/2012., Body mass index is 25.84 kg/(m^2). General: Well developed, well nourished, in no acute  distress. Head: Normocephalic, atraumatic, eyes without discharge, sclera non-icteric, nares are without discharge.   Neck: Supple. Full ROM. Lungs: Breathing is unlabored. Heart: Normal rate. Msk:  Strength and tone normal for age. Extremities/Skin: Warm and dry. No clubbing or cyanosis. No edema. No rashes or suspicious lesions. Right anterior knee with superficial abrasion over patella. Healthy granulation tissue present. Mild erythema laterally. No purulence expressed. Resolving ecchymosis inferiorly. No bony TTP. FROM. 5/5 strength.    Neuro: Alert and oriented X 3. Moves all extremities spontaneously. Gait is normal. CNII-XII grossly in tact. Psych:  Responds to questions appropriately with a normal affect.   Wound washed with soap and water. Irrigated. Xeroform dressing applied.    ASSESSMENT AND PLAN:  37 y.o. female with superficial abrasion to anterior knee with early cellulitis -Wound care per above -Doxycycline 100 mg 1 po bid 320 no RF -Daily dressing changes  -RTC prn   Signed, Eula Listen, PA-C 12/31/2012 7:15 PM

## 2013-01-19 ENCOUNTER — Other Ambulatory Visit: Payer: Self-pay | Admitting: Obstetrics and Gynecology

## 2013-01-19 DIAGNOSIS — N6320 Unspecified lump in the left breast, unspecified quadrant: Secondary | ICD-10-CM

## 2013-01-29 ENCOUNTER — Ambulatory Visit
Admission: RE | Admit: 2013-01-29 | Discharge: 2013-01-29 | Disposition: A | Discharge status: Home / Self Care | Payer: 59 | Source: Ambulatory Visit | Attending: Obstetrics and Gynecology | Admitting: Obstetrics and Gynecology

## 2013-01-29 DIAGNOSIS — N6320 Unspecified lump in the left breast, unspecified quadrant: Secondary | ICD-10-CM

## 2014-01-11 ENCOUNTER — Ambulatory Visit (INDEPENDENT_AMBULATORY_CARE_PROVIDER_SITE_OTHER): Payer: No Typology Code available for payment source | Admitting: Family Medicine

## 2014-01-11 VITALS — BP 82/62 | HR 62 | Temp 97.9°F | Resp 16 | Ht 67.25 in | Wt 155.0 lb

## 2014-01-11 DIAGNOSIS — T3695XA Adverse effect of unspecified systemic antibiotic, initial encounter: Secondary | ICD-10-CM

## 2014-01-11 DIAGNOSIS — J209 Acute bronchitis, unspecified: Secondary | ICD-10-CM

## 2014-01-11 DIAGNOSIS — J329 Chronic sinusitis, unspecified: Secondary | ICD-10-CM

## 2014-01-11 DIAGNOSIS — B379 Candidiasis, unspecified: Secondary | ICD-10-CM

## 2014-01-11 MED ORDER — AMOXICILLIN-POT CLAVULANATE 875-125 MG PO TABS: 1.0000 | ORAL_TABLET | Freq: Two times a day (BID) | ORAL | Status: DC

## 2014-01-11 MED ORDER — FLUCONAZOLE 150 MG PO TABS: 150.0000 mg | ORAL_TABLET | Freq: Once | ORAL | Status: DC

## 2014-01-11 MED ORDER — ALBUTEROL SULFATE HFA 108 (90 BASE) MCG/ACT IN AERS: 2.0000 | INHALATION_SPRAY | Freq: Four times a day (QID) | RESPIRATORY_TRACT | Status: DC | PRN

## 2014-01-11 NOTE — Progress Notes (Signed)
Sinusitis - Plan: amoxicillin-clavulanate (AUGMENTIN) 875-125 MG per tablet, albuterol (PROAIR HFA) 108 (90 BASE) MCG/ACT inhaler  Acute bronchitis - Plan: amoxicillin-clavulanate (AUGMENTIN) 875-125 MG per tablet, albuterol (PROAIR HFA) 108 (90 BASE) MCG/ACT inhaler  Antibiotic-induced yeast infection - Plan: fluconazole (DIFLUCAN) 150 MG tablet  Signed, Elvina Sidle, MD     Patient ID: Lori Jennings MRN: [id number], DOB: [birth year], 38 y.o. Date of Encounter: 01/11/2014, 10:18 AM  Primary Physician: No primary provider on file.  Chief Complaint:  Chief Complaint  Patient presents with  . Cough    X 3 weeks  . Chest Congestion    X 3 weeks    HPI: 38 y.o. year old female presents with 21 day history of nasal congestion, post nasal drip, sore throat, sinus pressure, and cough. Afebrile. No chills. Nasal congestion thick and green/yellow. Sinus pressure is the worst symptom. Cough is productive secondary to post nasal drip and not associated with time of day. Ears feel full, leading to sensation of muffled hearing. Has tried OTC cold preps without success. No GI complaints.   Patient works at [employer] especially pleased with the way her forehead laceration turned out from suture repair done by [employer], PA-C.  No recent antibiotics, recent travels, or sick contacts   No leg trauma, sedentary periods, h/o cancer, or tobacco use.  Past Medical History  Diagnosis Date  . Allergy      Home Meds: Prior to Admission medications   Medication Sig Start Date End Date Taking? Authorizing Provider  albuterol (PROAIR HFA) 108 (90 BASE) MCG/ACT inhaler Inhale 2 puffs into the lungs every 6 (six) hours as needed for wheezing or shortness of breath (cough or wheezing). 01/11/14   Elvina Sidle, MD  amoxicillin-clavulanate (AUGMENTIN) 875-125 MG per tablet Take 1 tablet by mouth 2 (two) times daily. 01/11/14   Elvina Sidle, MD  fluconazole (DIFLUCAN) 150 MG  tablet Take 1 tablet (150 mg total) by mouth once. 01/11/14   Elvina Sidle, MD    Allergies:  Allergies  Allergen Reactions  . Ciprocinonide [Fluocinolone] Hives  . Doxycycline Hives    History   Social History  . Marital Status: Single    Spouse Name: N/A    Number of Children: N/A  . Years of Education: N/A   Occupational History  . Not on file.   Social History Main Topics  . Smoking status: Former Games developer  . Smokeless tobacco: Not on file     Comment: 10-15 cigarettes per day  . Alcohol Use: Yes     Comment: rare  . Drug Use: No  . Sexual Activity: No   Other Topics Concern  . Not on file   Social History Narrative  . No narrative on file     Review of Systems: Constitutional: negative for chills, fever, night sweats or weight changes Cardiovascular: negative for chest pain or palpitations Respiratory: negative for hemoptysis, wheezing, or shortness of breath Abdominal: negative for abdominal pain, nausea, vomiting or diarrhea Dermatological: negative for rash Neurologic: negative for headache   Physical Exam: Blood pressure 82/62, pulse 62, temperature 97.9 F (36.6 C), temperature source Oral, resp. rate 16, height 5' 7.25" (1.708 m), weight 155 lb (70.308 kg), last menstrual period 12/28/2013, SpO2 98.00%., Body mass index is 24.1 kg/(m^2). General: Well developed, well nourished, in no acute distress. Head: Normocephalic, atraumatic, eyes without discharge, sclera non-icteric, nares are congested. Bilateral auditory canals clear, TM's are without perforation, pearly grey with reflective cone of light  bilaterally. Serous effusion bilaterally behind TM's. Maxillary sinus TTP. Oral cavity moist, dentition normal. Posterior pharynx with post nasal drip and mild erythema. No peritonsillar abscess or tonsillar exudate. Neck: Supple. No thyromegaly. Full ROM. No lymphadenopathy. Lungs: Clear bilaterally to auscultation without wheezes, rales, or rhonchi.  Breathing is unlabored.  Heart: RRR with S1 S2. No murmurs, rubs, or gallops appreciated. Msk:  Strength and tone normal for age. Extremities: No clubbing or cyanosis. No edema. Neuro: Alert and oriented X 3. Moves all extremities spontaneously. CNII-XII grossly in tact. Psych:  Responds to questions appropriately with a normal affect.     ASSESSMENT AND PLAN:  38 y.o. year old female with sinusitis -  -Tylenol/Motrin prn -Rest/fluids -RTC precautions -RTC 3-5 days if no improvement  Signed, Elvina SidleKurt Pastor Sgro, MD 01/11/2014 10:18 AM

## 2014-01-11 NOTE — Patient Instructions (Signed)
Sinusitis  Sinusitis is redness, soreness, and swelling (inflammation) of the paranasal sinuses. Paranasal sinuses are air pockets within the bones of your face (beneath the eyes, the middle of the forehead, or above the eyes). In healthy paranasal sinuses, mucus is able to drain out, and air is able to circulate through them by way of your nose. However, when your paranasal sinuses are inflamed, mucus and air can become trapped. This can allow bacteria and other germs to grow and cause infection.  Sinusitis can develop quickly and last only a short time (acute) or continue over a long period (chronic). Sinusitis that lasts for more than 12 weeks is considered chronic.   CAUSES   Causes of sinusitis include:   Allergies.   Structural abnormalities, such as displacement of the cartilage that separates your nostrils (deviated septum), which can decrease the air flow through your nose and sinuses and affect sinus drainage.   Functional abnormalities, such as when the small hairs (cilia) that line your sinuses and help remove mucus do not work properly or are not present.  SYMPTOMS   Symptoms of acute and chronic sinusitis are the same. The primary symptoms are pain and pressure around the affected sinuses. Other symptoms include:   Upper toothache.   Earache.   Headache.   Bad breath.   Decreased sense of smell and taste.   A cough, which worsens when you are lying flat.   Fatigue.   Fever.   Thick drainage from your nose, which often is green and may contain pus (purulent).   Swelling and warmth over the affected sinuses.  DIAGNOSIS   Your caregiver will perform a physical exam. During the exam, your caregiver may:   Look in your nose for signs of abnormal growths in your nostrils (nasal polyps).   Tap over the affected sinus to check for signs of infection.   View the inside of your sinuses (endoscopy) with a special imaging device with a light attached (endoscope), which is inserted into your  sinuses.  If your caregiver suspects that you have chronic sinusitis, one or more of the following tests may be recommended:   Allergy tests.   Nasal culture A sample of mucus is taken from your nose and sent to a lab and screened for bacteria.   Nasal cytology A sample of mucus is taken from your nose and examined by your caregiver to determine if your sinusitis is related to an allergy.  TREATMENT   Most cases of acute sinusitis are related to a viral infection and will resolve on their own within 10 days. Sometimes medicines are prescribed to help relieve symptoms (pain medicine, decongestants, nasal steroid sprays, or saline sprays).   However, for sinusitis related to a bacterial infection, your caregiver will prescribe antibiotic medicines. These are medicines that will help kill the bacteria causing the infection.   Rarely, sinusitis is caused by a fungal infection. In theses cases, your caregiver will prescribe antifungal medicine.  For some cases of chronic sinusitis, surgery is needed. Generally, these are cases in which sinusitis recurs more than 3 times per year, despite other treatments.  HOME CARE INSTRUCTIONS    Drink plenty of water. Water helps thin the mucus so your sinuses can drain more easily.   Use a humidifier.   Inhale steam 3 to 4 times a day (for example, sit in the bathroom with the shower running).   Apply a warm, moist washcloth to your face 3 to 4 times a day,   or as directed by your caregiver.   Use saline nasal sprays to help moisten and clean your sinuses.   Take over-the-counter or prescription medicines for pain, discomfort, or fever only as directed by your caregiver.  SEEK IMMEDIATE MEDICAL CARE IF:   You have increasing pain or severe headaches.   You have nausea, vomiting, or drowsiness.   You have swelling around your face.   You have vision problems.   You have a stiff neck.   You have difficulty breathing.  MAKE SURE YOU:    Understand these  instructions.   Will watch your condition.   Will get help right away if you are not doing well or get worse.  Document Released: 10/01/2005 Document Revised: 12/24/2011 Document Reviewed: 10/16/2011  [employer] Patient Information 2014 [employer]          Bronchitis  Bronchitis is inflammation of the airways that extend from the windpipe into the lungs (bronchi). The inflammation often causes mucus to develop, which leads to a cough. If the inflammation becomes severe, it may cause shortness of breath.  CAUSES   Bronchitis may be caused by:    Viral infections.    Bacteria.    Cigarette smoke.    Allergens, pollutants, and other irritants.   SIGNS AND SYMPTOMS   The most common symptom of bronchitis is a frequent cough that produces mucus. Other symptoms include:   Fever.    Body aches.    Chest congestion.    Chills.    Shortness of breath.    Sore throat.   DIAGNOSIS   Bronchitis is usually diagnosed through a medical history and physical exam. Tests, such as chest X-rays, are sometimes done to rule out other conditions.   TREATMENT   You may need to avoid contact with whatever caused the problem (smoking, for example). Medicines are sometimes needed. These may include:   Antibiotics. These may be prescribed if the condition is caused by bacteria.   Cough suppressants. These may be prescribed for relief of cough symptoms.    Inhaled medicines. These may be prescribed to help open your airways and make it easier for you to breathe.    Steroid medicines. These may be prescribed for those with recurrent (chronic) bronchitis.  HOME CARE INSTRUCTIONS   Get plenty of rest.    Drink enough fluids to keep your urine clear or pale yellow (unless you have a medical condition that requires fluid restriction). Increasing fluids may help thin your secretions and will prevent dehydration.    Only take over-the-counter or prescription medicines as directed by your health care  provider.   Only take antibiotics as directed. Make sure you finish them even if you start to feel better.   Avoid secondhand smoke, irritating chemicals, and strong fumes. These will make bronchitis worse. If you are a smoker, quit smoking. Consider using nicotine gum or skin patches to help control withdrawal symptoms. Quitting smoking will help your lungs heal faster.    Put a cool-mist humidifier in your bedroom at night to moisten the air. This may help loosen mucus. Change the water in the humidifier daily. You can also run the hot water in your shower and sit in the bathroom with the door closed for 5 10 minutes.    Follow up with your health care provider as directed.    Wash your hands frequently to avoid catching bronchitis again or spreading an infection to others.   SEEK MEDICAL CARE IF:  Your symptoms   do not improve after 1 week of treatment.   SEEK IMMEDIATE MEDICAL CARE IF:   Your fever increases.   You have chills.    You have chest pain.    You have worsening shortness of breath.    You have bloody sputum.   You faint.   You have lightheadedness.   You have a severe headache.    You vomit repeatedly.  MAKE SURE YOU:    Understand these instructions.   Will watch your condition.   Will get help right away if you are not doing well or get worse.  Document Released: 10/01/2005 Document Revised: 07/22/2013 Document Reviewed: 05/26/2013  [employer] Patient Information 2014 [employer]

## 2022-07-09 ENCOUNTER — Other Ambulatory Visit: Payer: Self-pay | Admitting: Obstetrics and Gynecology

## 2022-07-09 DIAGNOSIS — R928 Other abnormal and inconclusive findings on diagnostic imaging of breast: Secondary | ICD-10-CM

## 2022-07-23 ENCOUNTER — Ambulatory Visit
Admission: RE | Admit: 2022-07-23 | Discharge: 2022-07-23 | Disposition: A | Discharge status: Home / Self Care | Payer: Self-pay | Source: Ambulatory Visit | Attending: Obstetrics and Gynecology | Admitting: Obstetrics and Gynecology

## 2022-07-23 ENCOUNTER — Other Ambulatory Visit: Payer: Self-pay | Admitting: Obstetrics and Gynecology

## 2022-07-23 DIAGNOSIS — R928 Other abnormal and inconclusive findings on diagnostic imaging of breast: Secondary | ICD-10-CM

## 2022-07-23 DIAGNOSIS — N6320 Unspecified lump in the left breast, unspecified quadrant: Secondary | ICD-10-CM

## 2022-08-13 ENCOUNTER — Telehealth: Payer: Self-pay

## 2022-08-13 ENCOUNTER — Ambulatory Visit
Admission: RE | Admit: 2022-08-13 | Discharge: 2022-08-13 | Disposition: A | Discharge status: Home / Self Care | Payer: BC Managed Care – PPO | Source: Ambulatory Visit

## 2022-08-13 ENCOUNTER — Ambulatory Visit (INDEPENDENT_AMBULATORY_CARE_PROVIDER_SITE_OTHER): Payer: BC Managed Care – PPO

## 2022-08-13 VITALS — BP 97/69 | HR 91 | Temp 98.0°F | Resp 16

## 2022-08-13 DIAGNOSIS — J4521 Mild intermittent asthma with (acute) exacerbation: Secondary | ICD-10-CM

## 2022-08-13 DIAGNOSIS — R069 Unspecified abnormalities of breathing: Secondary | ICD-10-CM | POA: Diagnosis not present

## 2022-08-13 DIAGNOSIS — R059 Cough, unspecified: Secondary | ICD-10-CM | POA: Diagnosis not present

## 2022-08-13 DIAGNOSIS — J309 Allergic rhinitis, unspecified: Secondary | ICD-10-CM | POA: Diagnosis not present

## 2022-08-13 MED ORDER — METHYLPREDNISOLONE 4 MG PO TBPK: ORAL_TABLET | ORAL | 0 refills | Status: AC

## 2022-08-13 MED ORDER — MONTELUKAST SODIUM 10 MG PO TABS: 10.0000 mg | ORAL_TABLET | Freq: Every day | ORAL | 2 refills | Status: DC

## 2022-08-13 MED ORDER — AZITHROMYCIN 250 MG PO TABS: ORAL_TABLET | ORAL | 0 refills | Status: AC

## 2022-08-13 MED ORDER — PROMETHAZINE-DM 6.25-15 MG/5ML PO SYRP: 5.0000 mL | ORAL_SOLUTION | Freq: Four times a day (QID) | ORAL | 0 refills | Status: AC | PRN

## 2022-08-13 MED ORDER — ALBUTEROL SULFATE (2.5 MG/3ML) 0.083% IN NEBU
2.5000 mg | INHALATION_SOLUTION | Freq: Once | RESPIRATORY_TRACT | Status: AC
  Administered 2022-08-13: 2.5 mg via RESPIRATORY_TRACT

## 2022-08-13 MED ORDER — ALBUTEROL SULFATE HFA 108 (90 BASE) MCG/ACT IN AERS: 2.0000 | INHALATION_SPRAY | Freq: Four times a day (QID) | RESPIRATORY_TRACT | 5 refills | Status: AC | PRN

## 2022-08-13 MED ORDER — CETIRIZINE HCL 10 MG PO TABS: 10.0000 mg | ORAL_TABLET | Freq: Every day | ORAL | 1 refills | Status: DC

## 2022-08-13 MED ORDER — METHYLPREDNISOLONE SODIUM SUCC 125 MG IJ SOLR
125.0000 mg | Freq: Once | INTRAMUSCULAR | Status: AC
  Administered 2022-08-13: 125 mg via INTRAMUSCULAR

## 2022-08-13 MED ORDER — FLUTICASONE PROPIONATE 50 MCG/ACT NA SUSP: 1.0000 | Freq: Every day | NASAL | 2 refills | Status: AC

## 2022-08-13 MED ORDER — GUAIFENESIN 400 MG PO TABS: ORAL_TABLET | ORAL | 0 refills | Status: AC

## 2022-08-13 NOTE — Telephone Encounter (Signed)
Patient verification complete (name and date of birth).  Patient called having question of how to take her medications, all questions answered.

## 2022-08-13 NOTE — ED Provider Notes (Signed)
[facility]    CSN: [id number] Arrival date & time: 08/13/22  0903    HISTORY   Chief Complaint  Patient presents with   Cough    Seems I need a nebulizer treatment, caught a bad cold, showed symptoms Fri. at 3, progressed from head pain/sniffles/sneezing to my chest & my breathing is shallow/lungs feel uncomfortable. Took 1 dose of mucinex 12 hrs ago - Entered by patient   Shortness of Breath   Nasal Congestion   Headache   HPI Lori Jennings is a pleasant, 46 y.o. female who presents to urgent care today. Patient reports a 3-day history of headache, cough, heaviness in her chest and shortness of breath particularly with exertion.  Patient is not in respiratory distress at this time, has an oxygen saturation of 96%.  Patient states she has been using an old albuterol inhaler without any relief of her symptoms.  Patient states he is also been taking Mucinex, Zicam and zinc and using other home remedies also without relief.  Patient states she feels like she may need laser treatment today.  Patient states her initial symptoms were headache and sniffles sneezing which progressed to sensation of shallow breathing and lungs feeling uncomfortable.  Last dose of Mucinex was 12 hours ago.  The history is provided by the patient.  Cough Cough characteristics:  Non-productive Severity:  Moderate Onset quality:  Gradual Duration:  3 days Timing:  Intermittent Progression:  Worsening Chronicity:  Recurrent Smoker: Former smoker.   Context: exposure to allergens, sick contacts and with activity   Relieved by:  Nothing Worsened by:  Deep breathing and activity Ineffective treatments:  Decongestant, cough suppressants, beta-agonist inhaler, steam and fluids Associated symptoms: headaches and shortness of breath   Headaches:    Severity:  Mild   Onset quality: Generalized, dull. Shortness of Breath Severity:  Severe Onset quality:  Gradual Duration:  3 days Progression:   Worsening Chronicity:  Recurrent Context comment:  History of mild persistent asthma Relieved by:  Nothing Associated symptoms: cough and headaches   Headache Associated symptoms: cough    Past Medical History:  Diagnosis Date   Allergy    Patient Active Problem List   Diagnosis Date Noted   Concussion 10/28/2012   ASTHMA, UNSPECIFIED 02/13/2010   Pneumothorax 01/20/2010   CHEST PAIN 01/20/2010   Spontaneous tension pneumothorax 01/20/2010   Past Surgical History:  Procedure Laterality Date   EYE SURGERY     TONSILLECTOMY     OB History   No obstetric history on file.    Home Medications    Prior to Admission medications   Medication Sig Start Date End Date Taking? Authorizing Provider  venlafaxine XR (EFFEXOR-XR) 150 MG 24 hr capsule Take 150 mg by mouth daily.    [provider]    Family History Family History  Problem Relation Age of Onset   Esophageal cancer Father    Lung cancer Father    Diabetes Maternal Grandmother    Esophageal cancer Paternal Grandmother    Throat cancer Paternal Grandmother    Lung cancer Paternal Grandfather    Stomach cancer Paternal Grandfather    Social History Social History   Tobacco Use   Smoking status: Former   Tobacco comments:    10-15 cigarettes per day  Vaping Use   Vaping Use: Never used  Substance Use Topics   Alcohol use: Yes    Comment: rare   Drug use: No   Allergies   Ciprocinonide [fluocinolone], Ciprofloxacin,  and Doxycycline  Review of Systems Review of Systems  Respiratory:  Positive for cough and shortness of breath.   Neurological:  Positive for headaches.   Pertinent findings revealed after performing a 14 point review of systems has been noted in the history of present illness.  Physical Exam Triage Vital Signs ED Triage Vitals  Enc Vitals Group     BP 08/11/21 0827 (!) 147/82     Pulse Rate 08/11/21 0827 72     Resp 08/11/21 0827 18     Temp 08/11/21 0827 98.3 F (36.8 C)      Temp Source 08/11/21 0827 Oral     SpO2 08/11/21 0827 98 %     Weight --      Height --      Head Circumference --      Peak Flow --      Pain Score 08/11/21 0826 5     Pain Loc --      Pain Edu? --      Excl. in GC? --   No data found.  Updated Vital Signs BP 97/69 (BP Location: Left Arm)   Pulse 91   Temp 98 F (36.7 C) (Oral)   Resp 16   LMP 08/11/2022 (Approximate)   SpO2 96%   Physical Exam Vitals and nursing note reviewed.  Constitutional:      General: She is not in acute distress.    Appearance: Normal appearance. She is not ill-appearing.  HENT:     Head: Normocephalic and atraumatic.     Salivary Glands: Right salivary gland is not diffusely enlarged or tender. Left salivary gland is not diffusely enlarged or tender.     Right Ear: Ear canal and external ear normal. No drainage. A middle ear effusion is present. There is no impacted cerumen. Tympanic membrane is bulging. Tympanic membrane is not injected or erythematous.     Left Ear: Ear canal and external ear normal. No drainage. A middle ear effusion is present. There is no impacted cerumen. Tympanic membrane is bulging. Tympanic membrane is not injected or erythematous.     Ears:     Comments: Bilateral EACs normal, both TMs bulging with clear fluid    Nose: Rhinorrhea present. No nasal deformity, septal deviation, signs of injury, nasal tenderness, mucosal edema or congestion. Rhinorrhea is clear.     Right Nostril: Occlusion present. No foreign body, epistaxis or septal hematoma.     Left Nostril: Occlusion present. No foreign body, epistaxis or septal hematoma.     Right Turbinates: Enlarged, swollen and pale.     Left Turbinates: Enlarged, swollen and pale.     Right Sinus: No maxillary sinus tenderness or frontal sinus tenderness.     Left Sinus: No maxillary sinus tenderness or frontal sinus tenderness.     Mouth/Throat:     Lips: Pink. No lesions.     Mouth: Mucous membranes are moist. No oral  lesions.     Pharynx: Oropharynx is clear. Uvula midline. No posterior oropharyngeal erythema or uvula swelling.     Tonsils: No tonsillar exudate. 0 on the right. 0 on the left.     Comments: Postnasal drip Eyes:     General: Lids are normal.        Right eye: No discharge.        Left eye: No discharge.     Extraocular Movements: Extraocular movements intact.     Conjunctiva/sclera: Conjunctivae normal.     Right eye: Right conjunctiva  is not injected.     Left eye: Left conjunctiva is not injected.  Neck:     Trachea: Trachea and phonation normal.  Cardiovascular:     Rate and Rhythm: Normal rate and regular rhythm.     Pulses: Normal pulses.     Heart sounds: Normal heart sounds. No murmur heard.    No friction rub. No gallop.  Pulmonary:     Effort: Pulmonary effort is normal. Prolonged expiration present. No tachypnea, bradypnea, accessory muscle usage, respiratory distress or retractions.     Breath sounds: Decreased air movement present. No stridor or transmitted upper airway sounds. Examination of the right-upper field reveals wheezing. Examination of the left-upper field reveals wheezing. Examination of the right-middle field reveals wheezing. Examination of the left-middle field reveals wheezing. Examination of the right-lower field reveals wheezing. Examination of the left-lower field reveals wheezing. Wheezing present. No decreased breath sounds, rhonchi or rales.  Chest:     Chest wall: No tenderness.  Musculoskeletal:        General: Normal range of motion.     Cervical back: Normal range of motion and neck supple. Normal range of motion.  Lymphadenopathy:     Cervical: No cervical adenopathy.  Skin:    General: Skin is warm and dry.     Findings: No erythema or rash.  Neurological:     General: No focal deficit present.     Mental Status: She is alert and oriented to person, place, and time.  Psychiatric:        Mood and Affect: Mood normal.        Behavior:  Behavior normal.     Visual Acuity Right Eye Distance:   Left Eye Distance:   Bilateral Distance:    Right Eye Near:   Left Eye Near:    Bilateral Near:     [facility] / Diagnostics / Procedures:     Radiology DG Chest 2 View  Result Date: 08/13/2022 CLINICAL DATA:  Cough.  Abnormal breath sounds. EXAM: CHEST - 2 VIEW COMPARISON:  January 26, 2010 FINDINGS: The heart size and mediastinal contours are within normal limits. Both lungs are clear. The visualized skeletal structures are unremarkable. IMPRESSION: No active cardiopulmonary disease. Electronically Signed   By: Gerome Sam III M.D.   On: 08/13/2022 10:43    Procedures Procedures (including critical care time) EKG  Pending results:  Labs Reviewed - No data to display  Medications Ordered in UC: Medications  methylPREDNISolone sodium succinate (SOLU-MEDROL) 125 mg/2 mL injection 125 mg (125 mg Intramuscular Given 08/13/22 1010)  albuterol (PROVENTIL) (2.5 MG/3ML) 0.083% nebulizer solution 2.5 mg (2.5 mg Nebulization Given 08/13/22 1010)    [facility] Diagnoses / Final Clinical Impressions(s)   I have reviewed the triage vital signs and the nursing notes.  Pertinent labs & imaging results that were available during my care of the patient were reviewed by me and considered in my medical decision making (see chart for details).    Final diagnoses:  Allergic rhinitis with mild intermittent asthma without status asthmaticus, with acute exacerbation  Mild intermittent asthma with acute exacerbation in adult  Allergic rhinitis, unspecified seasonality, unspecified trigger   Patient advised of physical exam findings concerning for acute exacerbation and uncontrolled allergies.  Patient provided with Medrol Dosepak and albuterol inhaler for relief of asthma exacerbation.  Given duration of symptoms, recommend patient complete a short course of azithromycin.  Patient provided with allergy medications, Zyrtec, Flonase and Singulair.   Patient provided with  nighttime cough syrup Promethazine DM to help her get some sleep.  Also recommend Mucinex to thin secretions and make coughing more efficient.  Return precautions advised.  ED Prescriptions     Medication Sig Dispense Auth. Provider   cetirizine (ZYRTEC ALLERGY) 10 MG tablet Take 1 tablet (10 mg total) by mouth at bedtime. 90 tablet Theadora Rama Scales, PA-C   fluticasone (FLONASE) 50 MCG/ACT nasal spray Place 1 spray into both nostrils daily. Begin by using 2 sprays in each nare daily for 3 to 5 days, then decrease to 1 spray in each nare daily. 15.8 mL Theadora Rama Scales, PA-C   montelukast (SINGULAIR) 10 MG tablet Take 1 tablet (10 mg total) by mouth at bedtime. 30 tablet Theadora Rama Scales, PA-C   methylPREDNISolone (MEDROL DOSEPAK) 4 MG TBPK tablet Take 24 mg on day 1, 20 mg on day 2, 16 mg on day 3, 12 mg on day 4, 8 mg on day 5, 4 mg on day 6.  Take all tablets in each row at once, do not spread tablets out throughout the day. 21 tablet Theadora Rama Scales, PA-C   promethazine-dextromethorphan (PROMETHAZINE-DM) 6.25-15 MG/5ML syrup Take 5 mLs by mouth 4 (four) times daily as needed for cough. 118 mL Theadora Rama Scales, PA-C   guaifenesin (HUMIBID E) 400 MG TABS tablet Take 1 tablet 3 times daily as needed for chest congestion and cough 28 tablet Theadora Rama Scales, PA-C   albuterol (VENTOLIN HFA) 108 (90 Base) MCG/ACT inhaler Inhale 2 puffs into the lungs every 6 (six) hours as needed for wheezing or shortness of breath (Cough). 36 g Theadora Rama Scales, PA-C   azithromycin (ZITHROMAX) 250 MG tablet Take 2 tablets (500 mg total) by mouth daily for 1 day, THEN 1 tablet (250 mg total) daily for 4 days. 6 tablet Theadora Rama Scales, PA-C      PDMP not reviewed this encounter.  Disposition Upon Discharge:  Condition: stable for discharge home Home: take medications as prescribed; routine discharge instructions as discussed; follow up as  advised.  Patient presented with an acute illness with associated systemic symptoms and significant discomfort requiring urgent management. In my opinion, this is a condition that a prudent lay person (someone who possesses an average knowledge of health and medicine) may potentially expect to result in complications if not addressed urgently such as respiratory distress, impairment of bodily function or dysfunction of bodily organs.   Routine symptom specific, illness specific and/or disease specific instructions were discussed with the patient and/or caregiver at length.   As such, the patient has been evaluated and assessed, work-up was performed and treatment was provided in alignment with urgent care protocols and evidence based medicine.  Patient/parent/caregiver has been advised that the patient may require follow up for further testing and treatment if the symptoms continue in spite of treatment, as clinically indicated and appropriate.  If the patient was tested for COVID-19, Influenza and/or RSV, then the patient/parent/guardian was advised to isolate at home pending the results of his/her diagnostic coronavirus test and potentially longer if they're positive. I have also advised pt that if his/her COVID-19 test returns positive, it's recommended to self-isolate for at least 10 days after symptoms first appeared AND until fever-free for 24 hours without fever reducer AND other symptoms have improved or resolved. Discussed self-isolation recommendations as well as instructions for household member/close contacts as per the [facility] and [place] DHHS, and also gave patient the COVID packet with this information.  Patient/parent/caregiver has been  advised to return to the [facility] or PCP in 3-5 days if no better; to PCP or the Emergency Department if new signs and symptoms develop, or if the current signs or symptoms continue to change or worsen for further workup, evaluation and treatment as clinically indicated  and appropriate  The patient will follow up with their current PCP if and as advised. If the patient does not currently have a PCP we will assist them in obtaining one.   The patient may need specialty follow up if the symptoms continue, in spite of conservative treatment and management, for further workup, evaluation, consultation and treatment as clinically indicated and appropriate.  Patient/parent/caregiver verbalized understanding and agreement of plan as discussed.  All questions were addressed during visit.  Please see discharge instructions below for further details of plan.  Discharge Instructions:   Discharge Instructions      Your symptoms and my initial physical exam findings are concerning for exacerbation of your underlying allergies and mild intermittent asthma.  My physical exam findings after you received a steroid injection and an albuterol treatment are concerning for possible infection in your lungs.  Your chest x-ray did not reveal any acute pulmonary findings such as pneumonia or bronchitis.  I do feel that you would benefit from a short course of azithromycin as a preventative treatment for secondary lung infection due to acute asthma exacerbation as well as the anticipated immune suppression the results from taking steroids.   Please read below to learn more about the medications, dosages and frequencies that I recommend to help alleviate your symptoms and to get you feeling better soon:    Solu-Medrol IM (methylprednisolone):  To quickly address your significant respiratory inflammation, you were provided with an injection of Solu-Medrol in the office today.  You should continue to feel the full benefit of the steroid for the next 4 to 6 hours.    Medrol Dosepak (methylprednisolone): This is a steroid that will significantly calm your upper and lower airways, please take the daily recommended quantity of tablets daily with your breakfast meal starting tomorrow morning  until the prescription is complete.      Z-Pak (azithromycin): For prevention of pneumonia, take 2 tablets the first day, then take 1 tablet daily every day thereafter until complete.  Zyrtec (cetirizine): This is an excellent second-generation antihistamine that helps to reduce respiratory inflammatory response to environmental allergens.  In some patients, this medication can cause daytime sleepiness so I recommend that you take 1 tablet daily at bedtime.  I recommend that you continue this medication for a full 3 months to keep your allergy and asthma symptoms well controlled.   Singulair (montelukast): This is a mast cell stabilizer that works well with antihistamines.  Mast cells are responsible for stimulating histamine production so you can imagine that if we can reduce the activity of your mast cells, then fewer histamines will be produced and inflammation caused by allergy exposure will be significantly reduced.  I recommend that you take this medication at the same time you take your antihistamine.  I recommend that you continue this medication for a full 3 months to keep your allergy and asthma symptoms well controlled   Flonase (fluticasone): This is a steroid nasal spray that you use once daily, 1 spray in each nare.  This medication does not work well if you decide to use it only used as you feel you need to, it works best used on a daily basis.  After 3 to  5 days of use, you will notice significant reduction of the inflammation and mucus production that is currently being caused by exposure to allergens, whether seasonal or environmental.  The most common side effect of this medication is nosebleeds.  If you experience a nosebleed, please discontinue use for 1 week, then feel free to resume.  I recommend that you continue this medication for a full 3 months to keep your allergy symptoms well controlled   ProAir, Ventolin, Proventil (albuterol): This inhaled medication contains a short acting  beta agonist bronchodilator.  This medication works on the smooth muscle that opens and constricts of your airways by relaxing the muscle.  The result of relaxation of the smooth muscle is increased air movement and improved work of breathing.  This is a short acting medication that can be used every 4-6 hours as needed for increased work of breathing, shortness of breath, wheezing and excessive coughing.  For the next 1 to 2 weeks, please purposefully inhale 2 puffs 4 times daily.  After that, please purposefully inhale 2 puffs twice daily and repeat as needed for wheezing, shortness of breath and cough.  Once you feel that you are completely on the mend, please be sure that you always have an inhaler on hand developed symptoms of wheezing, shortness of breath or cough.  I have provided you with several refills at this prescription.    Robitussin, Mucinex (guaifenesin): This is an expectorant.  This helps break up chest congestion and loosen up thick nasal drainage making phlegm and drainage more liquid and therefore easier to remove.  I recommend being 400 mg three times daily for the next 1 to 2 weeks.      Promethazine DM: Promethazine is both a nasal decongestant and an antinausea medication that makes most patients feel fairly sleepy.  The DM is dextromethorphan, a cough suppressant found in many over-the-counter cough medications.  Please take 5 mL before bedtime to minimize your cough which will help you sleep better.   If you find that your health insurance will not pay for allergy medications, please consider downloading the GoodRx app and using to get a better price than the "off the shelf" price.     If you find that you have not had improvement of your symptoms in the next 3 to 5 days, please follow-up with your primary care provider or return here to urgent care for repeat evaluation and further recommendations.   Thank you for visiting urgent care today.  We appreciate the opportunity to  participate in your care.       This office note has been dictated using Museum/gallery curator.  Unfortunately, this method of dictation can sometimes lead to typographical or grammatical errors.  I apologize for your inconvenience in advance if this occurs.  Please do not hesitate to reach out to me if clarification is needed.      Lynden Oxford Scales, PA-C 08/14/22 2002

## 2022-08-13 NOTE — ED Triage Notes (Signed)
The patient states this past Friday she began having head ache, coughing, the patient states her chest feels heavy, congestion, and SOB. The patient is not in respiratory distress at this time.  The patient states she took mucinex, zycam, zinc, home remedies and albuterol inhaler for symptoms.

## 2022-08-13 NOTE — Discharge Instructions (Addendum)
Your symptoms and my initial physical exam findings are concerning for exacerbation of your underlying allergies and mild intermittent asthma.  My physical exam findings after you received a steroid injection and an albuterol treatment are concerning for possible infection in your lungs.  Your chest x-ray did not reveal any acute pulmonary findings such as pneumonia or bronchitis.  I do feel that you would benefit from a short course of azithromycin as a preventative treatment for secondary lung infection due to acute asthma exacerbation as well as the anticipated immune suppression the results from taking steroids.   Please read below to learn more about the medications, dosages and frequencies that I recommend to help alleviate your symptoms and to get you feeling better soon:    Solu-Medrol IM (methylprednisolone):  To quickly address your significant respiratory inflammation, you were provided with an injection of Solu-Medrol in the office today.  You should continue to feel the full benefit of the steroid for the next 4 to 6 hours.    Medrol Dosepak (methylprednisolone): This is a steroid that will significantly calm your upper and lower airways, please take the daily recommended quantity of tablets daily with your breakfast meal starting tomorrow morning until the prescription is complete.      Z-Pak (azithromycin): For prevention of pneumonia, take 2 tablets the first day, then take 1 tablet daily every day thereafter until complete.  Zyrtec (cetirizine): This is an excellent second-generation antihistamine that helps to reduce respiratory inflammatory response to environmental allergens.  In some patients, this medication can cause daytime sleepiness so I recommend that you take 1 tablet daily at bedtime.  I recommend that you continue this medication for a full 3 months to keep your allergy and asthma symptoms well controlled.   Singulair (montelukast): This is a mast cell stabilizer that works  well with antihistamines.  Mast cells are responsible for stimulating histamine production so you can imagine that if we can reduce the activity of your mast cells, then fewer histamines will be produced and inflammation caused by allergy exposure will be significantly reduced.  I recommend that you take this medication at the same time you take your antihistamine.  I recommend that you continue this medication for a full 3 months to keep your allergy and asthma symptoms well controlled   Flonase (fluticasone): This is a steroid nasal spray that you use once daily, 1 spray in each nare.  This medication does not work well if you decide to use it only used as you feel you need to, it works best used on a daily basis.  After 3 to 5 days of use, you will notice significant reduction of the inflammation and mucus production that is currently being caused by exposure to allergens, whether seasonal or environmental.  The most common side effect of this medication is nosebleeds.  If you experience a nosebleed, please discontinue use for 1 week, then feel free to resume.  I recommend that you continue this medication for a full 3 months to keep your allergy symptoms well controlled   ProAir, Ventolin, Proventil (albuterol): This inhaled medication contains a short acting beta agonist bronchodilator.  This medication works on the smooth muscle that opens and constricts of your airways by relaxing the muscle.  The result of relaxation of the smooth muscle is increased air movement and improved work of breathing.  This is a short acting medication that can be used every 4-6 hours as needed for increased work of breathing, shortness of  breath, wheezing and excessive coughing.  For the next 1 to 2 weeks, please purposefully inhale 2 puffs 4 times daily.  After that, please purposefully inhale 2 puffs twice daily and repeat as needed for wheezing, shortness of breath and cough.  Once you feel that you are completely on the  mend, please be sure that you always have an inhaler on hand developed symptoms of wheezing, shortness of breath or cough.  I have provided you with several refills at this prescription.    Robitussin, Mucinex (guaifenesin): This is an expectorant.  This helps break up chest congestion and loosen up thick nasal drainage making phlegm and drainage more liquid and therefore easier to remove.  I recommend being 400 mg three times daily for the next 1 to 2 weeks.      Promethazine DM: Promethazine is both a nasal decongestant and an antinausea medication that makes most patients feel fairly sleepy.  The DM is dextromethorphan, a cough suppressant found in many over-the-counter cough medications.  Please take 5 mL before bedtime to minimize your cough which will help you sleep better.   If you find that your health insurance will not pay for allergy medications, please consider downloading the GoodRx app and using to get a better price than the "off the shelf" price.     If you find that you have not had improvement of your symptoms in the next 3 to 5 days, please follow-up with your primary care provider or return here to urgent care for repeat evaluation and further recommendations.   Thank you for visiting urgent care today.  We appreciate the opportunity to participate in your care.

## 2022-09-17 ENCOUNTER — Other Ambulatory Visit: Payer: Self-pay

## 2023-01-30 ENCOUNTER — Ambulatory Visit (INDEPENDENT_AMBULATORY_CARE_PROVIDER_SITE_OTHER): Payer: 59

## 2023-01-30 ENCOUNTER — Ambulatory Visit
Admission: RE | Admit: 2023-01-30 | Discharge: 2023-01-30 | Disposition: A | Discharge status: Home / Self Care | Payer: 59 | Source: Ambulatory Visit | Attending: Urgent Care | Admitting: Urgent Care

## 2023-01-30 ENCOUNTER — Ambulatory Visit: Payer: Self-pay

## 2023-01-30 VITALS — BP 101/72 | HR 84 | Temp 98.6°F | Resp 18

## 2023-01-30 DIAGNOSIS — R059 Cough, unspecified: Secondary | ICD-10-CM | POA: Diagnosis not present

## 2023-01-30 DIAGNOSIS — J9383 Other pneumothorax: Secondary | ICD-10-CM

## 2023-01-30 DIAGNOSIS — J939 Pneumothorax, unspecified: Secondary | ICD-10-CM | POA: Diagnosis not present

## 2023-01-30 NOTE — Discharge Instructions (Addendum)
Please pick up an incentive spirometer today from a medical supply store. If you have more difficulty with your breathing go straight to the emergency room.

## 2023-01-30 NOTE — ED Provider Notes (Signed)
[name]  Note:  This document was prepared using Conservation officer, historic buildings and may include unintentional dictation errors.  MRN: [id number] DOB: 1976/01/21  Subjective:   Lori Jennings is a 47 y.o. female presenting for recheck on possible recurrent spontaneous pneumothorax of the left side.  Feels that not bothered fullness sensation of the left side of her thorax.  This started after having a significant coughing fit.  Patient reports that she has a history of these happening.  Previously she has had to have needle decompression and hospitalization when she had a spontaneous tension pneumothorax.  However for 2 other occasions, reports that she did not have to be hospitalized.  She has previously used an incentive spirometer but does not have one now.  Reports that her current symptoms feel the same as before.  No current facility-administered medications for this encounter.  Current Outpatient Medications:    venlafaxine XR (EFFEXOR-XR) 150 MG 24 hr capsule, Take 150 mg by mouth daily., Disp: , Rfl:    albuterol (VENTOLIN HFA) 108 (90 Base) MCG/ACT inhaler, Inhale 2 puffs into the lungs every 6 (six) hours as needed for wheezing or shortness of breath (Cough)., Disp: 36 g, Rfl: 5   cetirizine (ZYRTEC ALLERGY) 10 MG tablet, Take 1 tablet (10 mg total) by mouth at bedtime., Disp: 90 tablet, Rfl: 1   fluticasone (FLONASE) 50 MCG/ACT nasal spray, Place 1 spray into both nostrils daily. Begin by using 2 sprays in each nare daily for 3 to 5 days, then decrease to 1 spray in each nare daily., Disp: 15.8 mL, Rfl: 2   guaifenesin (HUMIBID E) 400 MG TABS tablet, Take 1 tablet 3 times daily as needed for chest congestion and cough, Disp: 28 tablet, Rfl: 0   methylPREDNISolone (MEDROL DOSEPAK) 4 MG TBPK tablet, Take 24 mg on day 1, 20 mg on day 2, 16 mg on day 3, 12 mg on day 4, 8 mg on day 5, 4 mg on day 6.  Take all tablets in each row at once, do not spread tablets out  throughout the day., Disp: 21 tablet, Rfl: 0   montelukast (SINGULAIR) 10 MG tablet, Take 1 tablet (10 mg total) by mouth at bedtime., Disp: 30 tablet, Rfl: 2   promethazine-dextromethorphan (PROMETHAZINE-DM) 6.25-15 MG/5ML syrup, Take 5 mLs by mouth 4 (four) times daily as needed for cough., Disp: 118 mL, Rfl: 0   Allergies  Allergen Reactions   Ciprofloxacin Hives   Doxycycline Hives    Past Medical History:  Diagnosis Date   Allergy      Past Surgical History:  Procedure Laterality Date   EYE SURGERY     TONSILLECTOMY      Family History  Problem Relation Age of Onset   Esophageal cancer Father    Lung cancer Father    Diabetes Maternal Grandmother    Esophageal cancer Paternal Grandmother    Throat cancer Paternal Grandmother    Lung cancer Paternal Grandfather    Stomach cancer Paternal Grandfather     Social History   Tobacco Use   Smoking status: Former   Tobacco comments:    10-15 cigarettes per day  Vaping Use   Vaping Use: Never used  Substance Use Topics   Alcohol use: Yes    Comment: rare   Drug use: No    ROS   Objective:   Vitals: BP 101/72 (BP Location: Left Arm)   Pulse 84   Temp 98.6 F (37 C) (  Oral)   Resp 18   LMP 01/25/2023 (Approximate)   SpO2 97%   Physical Exam Constitutional:      General: She is not in acute distress.    Appearance: Normal appearance. She is well-developed. She is not ill-appearing, toxic-appearing or diaphoretic.  HENT:     Head: Normocephalic and atraumatic.     Nose: Nose normal.     Mouth/Throat:     Mouth: Mucous membranes are moist.  Eyes:     General: No scleral icterus.       Right eye: No discharge.        Left eye: No discharge.     Extraocular Movements: Extraocular movements intact.  Cardiovascular:     Rate and Rhythm: Normal rate and regular rhythm.     Heart sounds: Normal heart sounds. No murmur heard.    No friction rub. No gallop.  Pulmonary:     Effort: Pulmonary effort is  normal. No respiratory distress.     Breath sounds: No stridor. No wheezing, rhonchi or rales.  Chest:     Chest wall: No tenderness.  Skin:    General: Skin is warm and dry.  Neurological:     General: No focal deficit present.     Mental Status: She is alert and oriented to person, place, and time.  Psychiatric:        Mood and Affect: Mood normal.        Behavior: Behavior normal.    DG Chest 2 View  Result Date: 01/30/2023 CLINICAL DATA:  Cough.  History of spontaneous pneumothorax. EXAM: CHEST - 2 VIEW COMPARISON:  Chest radiographs 08/13/2022 FINDINGS: The cardiomediastinal silhouette is within normal limits. There is a small left-sided apical and lateral pneumothorax which measures 2 cm in thickness at the apex. There is chronic coarsening of the interstitial markings. No airspace consolidation, edema, or pleural effusion is identified. No acute osseous abnormality is seen. These results were called by telephone at the time of interpretation on 01/30/2023 at 4:04 pm to provider [facility] , who verbally acknowledged these results. IMPRESSION: Small left pneumothorax. Electronically Signed   By: Sebastian Ache M.D.   On: 01/30/2023 16:06    Assessment and Plan :   PDMP not reviewed this encounter.  1. Recurrent spontaneous pneumothorax    Case discussed with Dr. Mosetta Putt, reports that his findings are comparable to the pneumothorax that she had in 2011.  I discussed this with patient as well.  She does not want to go to the emergency room.  She has a follow-up appointment with her pulmonologist on Friday.  I did discuss the risks of recurring and worsening untreated spontaneous pneumothorax, tension pneumothorax.  Patient verbalizes understanding, states that she will maintain strict ER precautions.  Otherwise, for now she will try and incentive spirometer.   Wallis Bamberg, [place] 01/30/23 1623

## 2023-01-30 NOTE — ED Triage Notes (Signed)
Pt has hx of spontaneous pneumothorax 2 (2003, 2011) on L side. Pt reports on Monday she had a coughing fit; the next morning she noticed a similar sensation in her chest as previous pneumos. Pt called Pulmonology but was unable to get an appt until Friday. Able to take full breaths. No acute distress.

## 2023-02-01 ENCOUNTER — Ambulatory Visit: Payer: 59 | Admitting: Pulmonary Disease

## 2023-02-01 ENCOUNTER — Ambulatory Visit (INDEPENDENT_AMBULATORY_CARE_PROVIDER_SITE_OTHER): Payer: 59

## 2023-02-01 ENCOUNTER — Encounter: Payer: Self-pay | Admitting: Pulmonary Disease

## 2023-02-01 ENCOUNTER — Telehealth: Payer: Self-pay | Admitting: Neonatal

## 2023-02-01 VITALS — BP 120/68 | HR 83 | Ht 67.0 in | Wt 186.2 lb

## 2023-02-01 DIAGNOSIS — R053 Chronic cough: Secondary | ICD-10-CM | POA: Diagnosis not present

## 2023-02-01 DIAGNOSIS — R062 Wheezing: Secondary | ICD-10-CM

## 2023-02-01 DIAGNOSIS — J9312 Secondary spontaneous pneumothorax: Secondary | ICD-10-CM

## 2023-02-01 DIAGNOSIS — J930 Spontaneous tension pneumothorax: Secondary | ICD-10-CM | POA: Diagnosis not present

## 2023-02-01 DIAGNOSIS — R059 Cough, unspecified: Secondary | ICD-10-CM

## 2023-02-01 DIAGNOSIS — J939 Pneumothorax, unspecified: Secondary | ICD-10-CM | POA: Diagnosis not present

## 2023-02-01 NOTE — Patient Instructions (Addendum)
Nice to meet you  The chest x-ray looks stable to me  Please return to clinic just for a chest x-ray, walk-in, Wednesday or later next week for surveillance  I sent a referral to cardiothoracic surgery given this has happened multiple times to you to see if there is anything they would recommend to do differently surgically etc.  Okay to return to work  Avoid strenuous exercise, weight lifting etc.  No flying.  No scuba diving etc.  None of these activities should be resumed until we give you the okay to do so.  Return to clinic based on findings on chest x-ray next week

## 2023-02-01 NOTE — Telephone Encounter (Signed)
Call report from [facility] for chest xray.   IMPRESSION: Small left apical pneumothorax is unchanged compared to prior exam.  Dr Judeth Horn is already aware of results. RN verbally told him of call report. MD aware and states he does not need a message sent to him. Nothing further needed

## 2023-02-01 NOTE — Progress Notes (Addendum)
  ID: Lori Jennings, female    DOB: 1976-05-21, 47 y.o.   MRN: [id number]  Chief Complaint  Patient presents with   Consult    Consult for pneumothorax. Pt states that this stated on Monday where she felt this sensation that she had pressure in her chest. Pt states she has a really bad cough in the past and she was fine and Monday she had a coughing fit and set off this sensation in her left chest. Chest tube noted in 03. Another in 2011 and lung re inflated after given pain medication.      Referring provider: Mitchel Honour, DO  HPI:   47 y.o. woman whom we are seeing for evaluation of pneumothorax.  Most recent ED note 01/30/2023 reviewed.  Most recent pulmonary note x 3 in 2011 via Dr. Sherene Sires reviewed.  Patient reports multiple pneumothoraces in the past.  2003 with chest tube placement.  Chest tube placement x 2 given recurrence shortly after removal of initial chest tube.  Eventually improved.Lori Jennings.  Then 2011.  Smaller at that point in time got better with observation, no intervention.  Does not appear she is met with thoracic surgery in the past despite recurrence.  A few days ago, she had a coughing fit.  She immediately felt a similar sensation of chest discomfort on the left.  Chest x-ray in the urgent care/17/2024 personally reviewed and interpreted as small predominantly apical pneumothorax largest dimension around 2 cm, evidence of hyperinflation on the PA and lateral views, query bulla left lower lung field, otherwise clear lungs.  She was sent home instructed to follow-up here.  In the last 2 days, she feels like to get a little bit better breath.  Little bit deeper breath.  Still with some residual discomfort.  She is optimistic things have gotten a little bit better, she feels certainly no worse.  Questionaires / Pulmonary Flowsheets:   ACT:      No data to display          MMRC:     No data to display          Epworth:      No data to display           Tests:   FENO:  No results found for: "NITRICOXIDE"  PFT:     No data to display          WALK:      No data to display          Imaging: Personally reviewed and as per EMR and discussion in this note DG Chest 2 View  Result Date: 01/30/2023 CLINICAL DATA:  Cough.  History of spontaneous pneumothorax. EXAM: CHEST - 2 VIEW COMPARISON:  Chest radiographs 08/13/2022 FINDINGS: The cardiomediastinal silhouette is within normal limits. There is a small left-sided apical and lateral pneumothorax which measures 2 cm in thickness at the apex. There is chronic coarsening of the interstitial markings. No airspace consolidation, edema, or pleural effusion is identified. No acute osseous abnormality is seen. These results were called by telephone at the time of interpretation on 01/30/2023 at 4:04 pm to provider [facility] , who verbally acknowledged these results. IMPRESSION: Small left pneumothorax. Electronically Signed   By: Sebastian Ache M.D.   On: 01/30/2023 16:06    Lab Results:  CBC No results found for: "WBC", "RBC", "HGB", "HCT", "PLT", "MCV", "MCH", "MCHC", "RDW", "LYMPHSABS", "MONOABS", "EOSABS", "BASOSABS"  BMET No results found for: "NA", "K", "CL", "CO2", "GLUCOSE", "  BUN", "CREATININE", "CALCIUM", "GFRNONAA", "GFRAA"  BNP No results found for: "BNP"  ProBNP No results found for: "PROBNP"  Specialty Problems       Pulmonary Problems   Pneumothorax    Centricity Description: SPONTANEOUS PNEUMOTHORAX Qualifier: History of By: Vernie Murders   Centricity Description: PNEUMOTHORAX Qualifier: Diagnosis of By: Sherene Sires MD, Charlaine Dalton  Problem list entry automatically replaced. Please review for accuracy.      Spontaneous tension pneumothorax    Centricity Description: SPONTANEOUS PNEUMOTHORAX Qualifier: History of  By: Vernie Murders   Centricity Description: PNEUMOTHORAX Qualifier: Diagnosis of  By: Sherene Sires MD, Charlaine Dalton        Allergies  Allergen  Reactions   Ciprofloxacin Hives   Doxycycline Hives    Immunization History  Administered Date(s) Administered   Tdap 10/25/2012    Past Medical History:  Diagnosis Date   Allergy     Tobacco History: Social History   Tobacco Use  Smoking Status Former  Smokeless Tobacco Not on file  Tobacco Comments   10-15 cigarettes per day   Counseling given: Not Answered Tobacco comments: 10-15 cigarettes per day   Continue to not smoke  Outpatient Encounter Medications as of 47/19/2024  Medication Sig   albuterol (VENTOLIN HFA) 108 (90 Base) MCG/ACT inhaler Inhale 2 puffs into the lungs every 6 (six) hours as needed for wheezing or shortness of breath (Cough).   cetirizine (ZYRTEC ALLERGY) 10 MG tablet Take 1 tablet (10 mg total) by mouth at bedtime.   fluticasone (FLONASE) 50 MCG/ACT nasal spray Place 1 spray into both nostrils daily. Begin by using 2 sprays in each nare daily for 3 to 5 days, then decrease to 1 spray in each nare daily.   guaifenesin (HUMIBID E) 400 MG TABS tablet Take 1 tablet 3 times daily as needed for chest congestion and cough   methylPREDNISolone (MEDROL DOSEPAK) 4 MG TBPK tablet Take 24 mg on day 1, 20 mg on day 2, 16 mg on day 3, 12 mg on day 4, 8 mg on day 5, 4 mg on day 6.  Take all tablets in each row at once, do not spread tablets out throughout the day.   promethazine-dextromethorphan (PROMETHAZINE-DM) 6.25-15 MG/5ML syrup Take 5 mLs by mouth 4 (four) times daily as needed for cough.   venlafaxine XR (EFFEXOR-XR) 150 MG 24 hr capsule Take 150 mg by mouth daily.   montelukast (SINGULAIR) 10 MG tablet Take 1 tablet (10 mg total) by mouth at bedtime.   No facility-administered encounter medications on file as of 47/19/2024.     Review of Systems  Review of Systems  No reliably reproducible chest pain on exertion.  No orthopnea or PND.  Comprehensive review of systems otherwise negative. Physical Exam  BP 120/68 (BP Location: Left Arm, Patient  Position: Sitting, Cuff Size: Normal)   Pulse 83   Ht  (1.702 m)   Wt 186 lb 3.2 oz (84.5 kg)   LMP 01/25/2023 (Approximate)   SpO2 94%   BMI 29.16 kg/m   Wt Readings from Last 5 Encounters:  02/01/23 186 lb 3.2 oz (84.5 kg)  01/11/14 155 lb (70.3 kg)  12/31/12 165 lb (74.8 kg)  11/01/12 156 lb 12.8 oz (71.1 kg)  10/28/12 157 lb (71.2 kg)    BMI Readings from Last 5 Encounters:  02/01/23 29.16 kg/m  01/11/14 24.10 kg/m  12/31/12 25.84 kg/m  11/01/12 24.56 kg/m  10/28/12 24.59 kg/m     Physical Exam General: Sitting in chair, in  no acute distress  eyes: EOMI, no icterus Neck: Supple, no JVP appreciated Pulmonary: Clear and equal breath sounds bilaterally, normal work of breathing Cardiovascular: Warm, no edema Abdomen: Nondistended, bowel sounds present MSK: No synovitis, no joint effusion Neuro: Normal gait, no weakness Psych: Normal mood, full affect   Assessment & Plan:   Spontaneous pneumothorax: At least the third in her lifetime, initially 2003 with chest tube placement and second in 2011 that appears to spontaneously resolve and do not see any documentation of instrumentation.  She denies any instrumentation at that time.  Suspected secondary in the setting of presumed emphysema.  Chest x-ray is hyperinflated on looks like there is a bulla in the left lower lobe but no cross-sectional imaging to confirm.  Right at 2 cm or less than 2 cm in greatest dimension.  Apically.  Very small to laterally.  No good window for chest tube insertion placed on most recent imaging 4/17.  Repeat chest x-ray today shows her demonstrate stability, pneumothorax unchanged on my review.  Given minimal symptoms and small size, lack of worsening interim, okay for ongoing surveillance.  Repeat chest x-ray next week ordered.  Given recurrence, I think surgical evaluation, thoracic surgical evaluation is reasonable, new referral placed today.   No follow-ups on file.   Karren Burly, MD 47/19/2024   This appointment required 61 minutes of patient care (this includes precharting, chart review, review of results, face-to-face care, etc.).

## 2023-02-01 NOTE — Telephone Encounter (Signed)
Stat Call Report from [facility] Pls call @ [phone] (Our Dr. Judeth Horn. Not Dr. Trinna Balloon as noted in error.)

## 2023-02-04 ENCOUNTER — Telehealth: Payer: Self-pay | Admitting: Pulmonary Disease

## 2023-02-04 DIAGNOSIS — J9312 Secondary spontaneous pneumothorax: Secondary | ICD-10-CM

## 2023-02-04 DIAGNOSIS — J939 Pneumothorax, unspecified: Secondary | ICD-10-CM

## 2023-02-04 NOTE — Telephone Encounter (Signed)
Contacted by thoracic surgery office.  They are requesting CT scan prior to referral for recurrent spontaneous pneumothorax.  CT scan ordered.  Please let patient know.  Thank you.

## 2023-02-11 ENCOUNTER — Ambulatory Visit
Admission: RE | Admit: 2023-02-11 | Discharge: 2023-02-11 | Disposition: A | Discharge status: Home / Self Care | Payer: 59 | Source: Ambulatory Visit | Attending: Obstetrics & Gynecology | Admitting: Obstetrics & Gynecology

## 2023-02-11 DIAGNOSIS — J9312 Secondary spontaneous pneumothorax: Secondary | ICD-10-CM

## 2023-02-11 DIAGNOSIS — J939 Pneumothorax, unspecified: Secondary | ICD-10-CM

## 2023-02-11 DIAGNOSIS — J9383 Other pneumothorax: Secondary | ICD-10-CM | POA: Diagnosis not present

## 2023-02-13 ENCOUNTER — Other Ambulatory Visit: Payer: Self-pay | Admitting: Thoracic Surgery (Cardiothoracic Vascular Surgery)

## 2023-02-13 DIAGNOSIS — J939 Pneumothorax, unspecified: Secondary | ICD-10-CM

## 2023-02-15 ENCOUNTER — Encounter: Payer: 59 | Admitting: Thoracic Surgery (Cardiothoracic Vascular Surgery)

## 2023-02-27 DIAGNOSIS — F331 Major depressive disorder, recurrent, moderate: Secondary | ICD-10-CM | POA: Diagnosis not present

## 2023-02-27 DIAGNOSIS — F418 Other specified anxiety disorders: Secondary | ICD-10-CM | POA: Diagnosis not present

## 2023-03-08 ENCOUNTER — Encounter: Payer: 59 | Admitting: Thoracic Surgery (Cardiothoracic Vascular Surgery)

## 2023-03-25 ENCOUNTER — Other Ambulatory Visit: Payer: Self-pay | Admitting: Thoracic Surgery (Cardiothoracic Vascular Surgery)

## 2023-03-25 DIAGNOSIS — J9383 Other pneumothorax: Secondary | ICD-10-CM

## 2023-04-08 NOTE — Progress Notes (Signed)
[address]       [place] [address]             [phone]                    Carleah Barut [facility] Medical Record #[id number] Date of Birth: 07-13-1976  Referring: Karren Burly, MD Primary Care: Mitchel Honour, DO Primary Cardiologist: None  Chief Complaint:    Chief Complaint  Patient presents with   Spontaneous Pneumothorax    New patient consultation xray 4/19    History of Present Illness:    Lori Jennings 47 y.o. female presents for surgical evaluation for his recurrent left-sided spontaneous pneumothorax.  The first pneumothorax occurred in 2003 was treated with a chest tube.  This to be removed after 1 day and she had a recurrence requiring repeat chest tube placement.  The next episode was in 2011 which was treated conservatively.  She subsequently had another pneumothorax in 2024 which was also treated conservatively.  She currently denies any respiratory symptoms.  She has quit smoking.        Past Medical History:  Diagnosis Date   Allergy     Past Surgical History:  Procedure Laterality Date   EYE SURGERY     TONSILLECTOMY      Family History  Problem Relation Age of Onset   Esophageal cancer Father    Lung cancer Father    Diabetes Maternal Grandmother    Esophageal cancer Paternal Grandmother    Throat cancer Paternal Grandmother    Lung cancer Paternal Grandfather    Stomach cancer Paternal Grandfather      Social History   Tobacco Use  Smoking Status Former  Smokeless Tobacco Not on file  Tobacco Comments   10-15 cigarettes per day    Social History   Substance and Sexual Activity  Alcohol Use Yes   Comment: rare     Allergies  Allergen Reactions   Ciprofloxacin Hives   Doxycycline Hives    Current Outpatient Medications  Medication Sig Dispense Refill   albuterol (VENTOLIN HFA) 108 (90 Base) MCG/ACT inhaler Inhale 2 puffs into the lungs every 6 (six) hours as needed for wheezing or shortness of  breath (Cough). 36 g 5   fluticasone (FLONASE) 50 MCG/ACT nasal spray Place 1 spray into both nostrils daily. Begin by using 2 sprays in each nare daily for 3 to 5 days, then decrease to 1 spray in each nare daily. 15.8 mL 2   guaifenesin (HUMIBID E) 400 MG TABS tablet Take 1 tablet 3 times daily as needed for chest congestion and cough 28 tablet 0   methylPREDNISolone (MEDROL DOSEPAK) 4 MG TBPK tablet Take 24 mg on day 1, 20 mg on day 2, 16 mg on day 3, 12 mg on day 4, 8 mg on day 5, 4 mg on day 6.  Take all tablets in each row at once, do not spread tablets out throughout the day. 21 tablet 0   promethazine-dextromethorphan (PROMETHAZINE-DM) 6.25-15 MG/5ML syrup Take 5 mLs by mouth 4 (four) times daily as needed for cough. 118 mL 0   venlafaxine XR (EFFEXOR-XR) 150 MG 24 hr capsule Take 150 mg by mouth daily.     No current facility-administered medications for this visit.    Review of Systems  Constitutional: Negative.   Respiratory: Negative.    Cardiovascular: Negative.      PHYSICAL EXAMINATION: BP 117/81 (BP Location: Left Arm,  Patient Position: Sitting, Cuff Size: Normal)   Pulse 67   Resp 20   Ht 5\' 7"  (1.702 m)   Wt 180 lb (81.6 kg)   SpO2 97% Comment: RA  BMI 28.19 kg/m  Physical Exam Constitutional:      General: She is not in acute distress.    Appearance: She is not ill-appearing.  Eyes:     Extraocular Movements: Extraocular movements intact.  Cardiovascular:     Rate and Rhythm: Normal rate.  Pulmonary:     Effort: Pulmonary effort is normal. No respiratory distress.  Skin:    General: Skin is warm and dry.  Neurological:     General: No focal deficit present.     Mental Status: She is alert and oriented to person, place, and time.     Diagnostic Studies & Laboratory data:     Recent Radiology Findings:   No results found.     I have independently reviewed the above radiology studies  and reviewed the findings with the patient.   Recent Lab  Findings: No results found for: "WBC", "HGB", "HCT", "PLT", "GLUCOSE", "CHOL", "TRIG", "HDL", "LDLDIRECT", "LDLCALC", "ALT", "AST", "NA", "K", "CL", "CREATININE", "BUN", "CO2", "TSH", "INR", "GLUF", "HGBA1C"     Assessment / Plan:   47yo female with hx of multiple left pneumothoraces.  Lung fields appear clear on CT chest.  We discussed the risks and benefits of a L RATS, wedge resection, apical pleurectomy, and mechanical pleurodesis.  Given that she is not symptomatic she would like to wait and avoid surgery.       I  spent 40 minutes with  the patient face to face in counseling and coordination of care.    Corliss Skains 04/12/2023 3:17 PM

## 2023-04-12 ENCOUNTER — Encounter: Payer: Self-pay | Admitting: Thoracic Surgery (Cardiothoracic Vascular Surgery)

## 2023-04-12 ENCOUNTER — Institutional Professional Consult (permissible substitution): Payer: 59 | Admitting: Thoracic Surgery (Cardiothoracic Vascular Surgery)

## 2023-04-12 VITALS — BP 117/81 | HR 67 | Resp 20 | Ht 67.0 in | Wt 180.0 lb

## 2023-04-12 DIAGNOSIS — J939 Pneumothorax, unspecified: Secondary | ICD-10-CM

## 2023-04-16 DIAGNOSIS — F3181 Bipolar II disorder: Secondary | ICD-10-CM | POA: Diagnosis not present

## 2023-04-16 DIAGNOSIS — F418 Other specified anxiety disorders: Secondary | ICD-10-CM | POA: Diagnosis not present

## 2023-04-16 DIAGNOSIS — F331 Major depressive disorder, recurrent, moderate: Secondary | ICD-10-CM | POA: Diagnosis not present

## 2023-04-29 DIAGNOSIS — F3181 Bipolar II disorder: Secondary | ICD-10-CM | POA: Diagnosis not present

## 2023-04-29 DIAGNOSIS — F331 Major depressive disorder, recurrent, moderate: Secondary | ICD-10-CM | POA: Diagnosis not present

## 2023-04-29 DIAGNOSIS — F418 Other specified anxiety disorders: Secondary | ICD-10-CM | POA: Diagnosis not present

## 2023-09-16 DIAGNOSIS — F32A Depression, unspecified: Secondary | ICD-10-CM | POA: Diagnosis not present

## 2023-09-16 DIAGNOSIS — F331 Major depressive disorder, recurrent, moderate: Secondary | ICD-10-CM | POA: Diagnosis not present

## 2023-09-16 DIAGNOSIS — Z79899 Other long term (current) drug therapy: Secondary | ICD-10-CM | POA: Diagnosis not present

## 2023-09-16 DIAGNOSIS — Z818 Family history of other mental and behavioral disorders: Secondary | ICD-10-CM | POA: Diagnosis not present

## 2023-09-16 DIAGNOSIS — Z87891 Personal history of nicotine dependence: Secondary | ICD-10-CM | POA: Diagnosis not present

## 2023-09-16 DIAGNOSIS — F419 Anxiety disorder, unspecified: Secondary | ICD-10-CM | POA: Diagnosis not present

## 2023-09-16 DIAGNOSIS — Z560 Unemployment, unspecified: Secondary | ICD-10-CM | POA: Diagnosis not present

## 2023-09-16 DIAGNOSIS — Z716 Tobacco abuse counseling: Secondary | ICD-10-CM | POA: Diagnosis not present

## 2023-09-16 DIAGNOSIS — F3181 Bipolar II disorder: Secondary | ICD-10-CM | POA: Diagnosis not present

## 2023-09-16 DIAGNOSIS — Z59811 Housing instability, housed, with risk of homelessness: Secondary | ICD-10-CM | POA: Diagnosis not present

## 2023-09-16 DIAGNOSIS — Z5982 Transportation insecurity: Secondary | ICD-10-CM | POA: Diagnosis not present

## 2023-09-16 DIAGNOSIS — F418 Other specified anxiety disorders: Secondary | ICD-10-CM | POA: Diagnosis not present

## 2023-09-16 DIAGNOSIS — Z5986 Financial insecurity: Secondary | ICD-10-CM | POA: Diagnosis not present

## 2023-09-16 DIAGNOSIS — R531 Weakness: Secondary | ICD-10-CM | POA: Diagnosis not present

## 2023-09-16 DIAGNOSIS — Z5941 Food insecurity: Secondary | ICD-10-CM | POA: Diagnosis not present

## 2023-09-16 DIAGNOSIS — R45851 Suicidal ideations: Secondary | ICD-10-CM | POA: Diagnosis not present

## 2023-09-16 DIAGNOSIS — F339 Major depressive disorder, recurrent, unspecified: Secondary | ICD-10-CM | POA: Diagnosis not present

## 2023-09-20 DIAGNOSIS — F418 Other specified anxiety disorders: Secondary | ICD-10-CM | POA: Diagnosis not present

## 2023-09-20 DIAGNOSIS — F331 Major depressive disorder, recurrent, moderate: Secondary | ICD-10-CM | POA: Diagnosis not present

## 2023-09-20 DIAGNOSIS — F3181 Bipolar II disorder: Secondary | ICD-10-CM | POA: Diagnosis not present

## 2023-09-27 DIAGNOSIS — F3181 Bipolar II disorder: Secondary | ICD-10-CM | POA: Diagnosis not present

## 2023-09-27 DIAGNOSIS — F331 Major depressive disorder, recurrent, moderate: Secondary | ICD-10-CM | POA: Diagnosis not present

## 2023-09-27 DIAGNOSIS — F418 Other specified anxiety disorders: Secondary | ICD-10-CM | POA: Diagnosis not present

## 2023-10-11 DIAGNOSIS — F331 Major depressive disorder, recurrent, moderate: Secondary | ICD-10-CM | POA: Diagnosis not present

## 2023-10-11 DIAGNOSIS — F418 Other specified anxiety disorders: Secondary | ICD-10-CM | POA: Diagnosis not present

## 2023-10-11 DIAGNOSIS — F3181 Bipolar II disorder: Secondary | ICD-10-CM | POA: Diagnosis not present

## 2024-07-16 ENCOUNTER — Other Ambulatory Visit: Payer: Self-pay | Admitting: Obstetrics & Gynecology

## 2024-07-16 DIAGNOSIS — R928 Other abnormal and inconclusive findings on diagnostic imaging of breast: Secondary | ICD-10-CM

## 2024-08-12 ENCOUNTER — Ambulatory Visit
Admission: RE | Admit: 2024-08-12 | Discharge: 2024-08-12 | Disposition: A | Discharge status: Home / Self Care | Source: Ambulatory Visit | Attending: Obstetrics & Gynecology | Admitting: Obstetrics & Gynecology

## 2024-08-12 ENCOUNTER — Ambulatory Visit
Admission: RE | Admit: 2024-08-12 | Discharge: 2024-08-12 | Disposition: A | Source: Ambulatory Visit | Attending: Obstetrics & Gynecology | Admitting: Obstetrics & Gynecology

## 2024-08-12 DIAGNOSIS — R928 Other abnormal and inconclusive findings on diagnostic imaging of breast: Secondary | ICD-10-CM
